# Patient Record
Sex: Female | Born: 1950 | Race: White | Hispanic: No | Marital: Married | Smoking: Former smoker
Health system: Southern US, Community
[De-identification: ages and names within clinical notes are randomized; demographics above are authoritative.]

## PROBLEM LIST (undated history)

## (undated) DIAGNOSIS — R011 Cardiac murmur, unspecified: Secondary | ICD-10-CM

## (undated) DIAGNOSIS — I499 Cardiac arrhythmia, unspecified: Secondary | ICD-10-CM

## (undated) DIAGNOSIS — R55 Syncope and collapse: Secondary | ICD-10-CM

## (undated) DIAGNOSIS — I341 Nonrheumatic mitral (valve) prolapse: Secondary | ICD-10-CM

## (undated) HISTORY — DX: Syncope and collapse: R55

## (undated) HISTORY — DX: Nonrheumatic mitral (valve) prolapse: I34.1

## (undated) HISTORY — PX: VAGINAL HYSTERECTOMY: SUR661

## (undated) HISTORY — DX: Cardiac murmur, unspecified: R01.1

## (undated) HISTORY — PX: HERNIA REPAIR: SHX51

## (undated) HISTORY — PX: OTHER SURGICAL HISTORY: SHX169

## (undated) HISTORY — DX: Cardiac arrhythmia, unspecified: I49.9

---

## 2004-07-11 ENCOUNTER — Other Ambulatory Visit: Payer: Self-pay

## 2004-07-16 ENCOUNTER — Ambulatory Visit: Payer: Self-pay | Admitting: Orthopaedic Surgery

## 2005-06-03 ENCOUNTER — Other Ambulatory Visit: Payer: Self-pay

## 2005-06-03 ENCOUNTER — Emergency Department: Payer: Self-pay | Admitting: Emergency Medicine

## 2005-09-15 ENCOUNTER — Ambulatory Visit: Payer: Self-pay | Admitting: Family Medicine

## 2006-08-05 ENCOUNTER — Emergency Department (HOSPITAL_COMMUNITY): Admission: EM | Admit: 2006-08-05 | Discharge: 2006-08-05 | Payer: Self-pay | Admitting: Emergency Medicine

## 2006-08-10 ENCOUNTER — Inpatient Hospital Stay (HOSPITAL_COMMUNITY): Admission: RE | Admit: 2006-08-10 | Discharge: 2006-08-12 | Payer: Self-pay | Admitting: Orthopedic Surgery

## 2006-09-01 ENCOUNTER — Encounter: Payer: Self-pay | Admitting: Orthopedic Surgery

## 2006-09-17 ENCOUNTER — Encounter: Payer: Self-pay | Admitting: Orthopedic Surgery

## 2006-10-18 ENCOUNTER — Encounter: Payer: Self-pay | Admitting: Orthopedic Surgery

## 2006-10-26 ENCOUNTER — Ambulatory Visit: Payer: Self-pay | Admitting: Specialist

## 2006-11-17 ENCOUNTER — Encounter: Payer: Self-pay | Admitting: Orthopedic Surgery

## 2008-04-07 ENCOUNTER — Emergency Department (HOSPITAL_COMMUNITY): Admission: EM | Admit: 2008-04-07 | Discharge: 2008-04-07 | Payer: Self-pay | Admitting: Emergency Medicine

## 2009-07-25 ENCOUNTER — Observation Stay: Payer: Self-pay | Admitting: Internal Medicine

## 2009-07-31 ENCOUNTER — Ambulatory Visit: Payer: Self-pay | Admitting: Cardiovascular Disease

## 2009-08-15 ENCOUNTER — Ambulatory Visit: Payer: Self-pay | Admitting: Family Medicine

## 2010-04-08 ENCOUNTER — Ambulatory Visit (HOSPITAL_BASED_OUTPATIENT_CLINIC_OR_DEPARTMENT_OTHER): Admission: RE | Admit: 2010-04-08 | Discharge: 2010-04-08 | Payer: Self-pay | Admitting: Urology

## 2010-07-30 LAB — POCT I-STAT 4, (NA,K, GLUC, HGB,HCT)
Hemoglobin: 15 g/dL (ref 12.0–15.0)
Sodium: 143 mEq/L (ref 135–145)

## 2010-08-22 ENCOUNTER — Observation Stay (HOSPITAL_COMMUNITY)
Admission: EM | Admit: 2010-08-22 | Discharge: 2010-08-23 | Disposition: A | Discharge status: Home / Self Care | Payer: BC Managed Care – PPO | Attending: General Surgery | Admitting: General Surgery

## 2010-08-22 ENCOUNTER — Emergency Department (HOSPITAL_COMMUNITY): Payer: BC Managed Care – PPO

## 2010-08-22 ENCOUNTER — Encounter (HOSPITAL_COMMUNITY): Payer: Self-pay | Admitting: Radiology

## 2010-08-22 DIAGNOSIS — M549 Dorsalgia, unspecified: Secondary | ICD-10-CM | POA: Insufficient documentation

## 2010-08-22 DIAGNOSIS — G8929 Other chronic pain: Secondary | ICD-10-CM | POA: Insufficient documentation

## 2010-08-22 DIAGNOSIS — R5381 Other malaise: Secondary | ICD-10-CM | POA: Insufficient documentation

## 2010-08-22 DIAGNOSIS — I059 Rheumatic mitral valve disease, unspecified: Secondary | ICD-10-CM | POA: Insufficient documentation

## 2010-08-22 DIAGNOSIS — K3580 Unspecified acute appendicitis: Principal | ICD-10-CM | POA: Insufficient documentation

## 2010-08-22 LAB — COMPREHENSIVE METABOLIC PANEL
AST: 19 U/L (ref 0–37)
CO2: 28 mEq/L (ref 19–32)
GFR calc Af Amer: >60 mL/min (ref >60)
Sodium: 138 mEq/L (ref 135–145)
Total Bilirubin: 0.4 mg/dL (ref 0.3–1.2)

## 2010-08-22 LAB — URINALYSIS, ROUTINE W REFLEX MICROSCOPIC
Bilirubin Urine: NEGATIVE
Glucose, UA: NEGATIVE mg/dL
Nitrite: NEGATIVE
Specific Gravity, Urine: 1.007 (ref 1.005–1.030)
pH: 6.5 (ref 5.0–8.0)

## 2010-08-22 LAB — DIFFERENTIAL
Basophils Absolute: 0.1 10*3/uL (ref 0.0–0.1)
Basophils Relative: 1 % (ref 0–1)
Eosinophils Absolute: 0.4 10*3/uL (ref 0.0–0.7)
Eosinophils Relative: 3 % (ref 0–5)
Lymphs Abs: 2.6 10*3/uL (ref 0.7–4.0)
Monocytes Absolute: 0.8 10*3/uL (ref 0.1–1.0)
Monocytes Relative: 7 % (ref 3–12)
Neutro Abs: 7.7 10*3/uL (ref 1.7–7.7)

## 2010-08-22 LAB — URINE MICROSCOPIC-ADD ON

## 2010-08-22 LAB — CBC
RBC: 4.27 MIL/uL (ref 3.87–5.11)
RDW: 13.6 % (ref 11.5–15.5)
WBC: 11.5 10*3/uL — ABNORMAL HIGH (ref 4.0–10.5)

## 2010-08-22 LAB — LIPASE, BLOOD: Lipase: 22 U/L (ref 11–59)

## 2010-08-22 MED ORDER — IOHEXOL 300 MG/ML  SOLN
100.0000 mL | Freq: Once | INTRAMUSCULAR | Status: AC | PRN
  Administered 2010-08-22: 100 mL via INTRAVENOUS

## 2010-08-23 ENCOUNTER — Other Ambulatory Visit: Payer: Self-pay | Admitting: General Surgery

## 2010-08-28 NOTE — H&P (Signed)
  NAMEDEISY, OZBUN            ACCOUNT NO.:  [id number]  MEDICAL RECORD NO.:  [id number]           PATIENT TYPE:  O  LOCATION:  5155                         FACILITY:  [facility]  PHYSICIAN:  Lennie Muckle, MD      DATE OF BIRTH:  Sep 25, 1950  DATE OF ADMISSION:  08/22/2010 DATE OF DISCHARGE:  08/23/2010                             HISTORY & PHYSICAL   CHIEF COMPLAINT:  Abdominal pain.  HISTORY OF PRESENT ILLNESS:  Ms. Louks is a 60 year old female who had onset of abdominal pain yesterday.  It was mostly in the right lower quadrant.  The pain persisted today.  She had no nausea or vomiting but had anorexia.  She has had no diarrhea.  She had bronchitis approximately 2 months ago and was placed on antibiotics and had been doing better but she had been chronically tired.  The abdominal pain, however, was new.  She did receive a CT scan which showed a thickened appendix and stranding consistent with acute appendicitis.  Her white count was elevated to 11.5.  PAST MEDICAL HISTORY: 1. Chronic back pain. 2. Interstitial cystitis. 3. Mitral valve prolapse.  PAST SURGICAL HISTORY:  She had a hysterectomy, hernia repair, tubal ligation, and knee surgery.  FAMILY HISTORY:  Noncontributory.  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 No tobacco or alcohol use.  DRUG ALLERGIES:  SULFA.  MEDICATIONS:  Amitriptyline, Elmiron, and fludrocortisone.  REVIEW OF SYSTEMS:  Negative other than in the HPI.  PHYSICAL EXAMINATION:  GENERAL:  She is a well-developed, well-nourished female lying in stretcher in no acute distress. VITAL SIGNS:  Temperature is 98.5, pulse 79, and blood pressure 159/79. HEENT:  Head is normocephalic and atraumatic.  Extraocular movements are intact.  Pupils are equal, round, and reactive to light.  Sclerae and conjunctivae are clear.  Oral mucosa is pink and moist.  Dentition is good. NECK:  No tenderness.  Normal range of motion.   The trachea is midline. The thyroid is without palpable abnormalities. CHEST:  Clear to auscultation bilaterally.  Normal expansion and excursion. CARDIOVASCULAR:  Regular rate and rhythm.  No murmurs, gallops, or rubs. ABDOMEN:  Soft.  She does have some mild tenderness in the right lower quadrant with no peritoneal signs.  No rebound tenderness.  No organomegaly or masses. MUSCULOSKELETAL:  No pain with palpation in the joints.  Normal range of motion. SKIN:  No rashes and no jaundice. PSYCHOLOGIC:  Normal affect.  IMAGING:  CT was reviewed.  There is a thickened appendix with stranding noted.  ASSESSMENT AND PLAN:  Acute appendicitis.  We will admit with IV antibiotics and plan laparoscopic appendectomy later in the morning. Risk of surgery including, but not limited to bleeding, infection, abscess formation were explained.  Hopefully, should be able to be discharged home later on tomorrow afternoon.     Lennie Muckle, MD     ALA/MEDQ  D:  08/22/2010  T:  08/23/2010  Job:  [id number]  Electronically Signed by Bertram Savin MD on 08/28/2010 12:34:05 PM

## 2010-08-28 NOTE — Op Note (Signed)
Allison [name], Allison [name]            ACCOUNT NO.:  [id number]  MEDICAL RECORD NO.:  [id number]           PATIENT TYPE:  O  LOCATION:  5155                         FACILITY:  [facility]  PHYSICIAN:  Lennie Muckle, MD      DATE OF BIRTH:  1950/07/20  DATE OF PROCEDURE:  08/23/2010 DATE OF DISCHARGE:  08/23/2010                              OPERATIVE REPORT   PREOPERATIVE DIAGNOSIS:  Acute appendicitis.  POSTOPERATIVE DIAGNOSIS:  Acute appendicitis.  PROCEDURE:  Laparoscopic appendectomy.  SURGEON:  Kyshawn Teal L. Freida Busman, MD  ASSISTANT:  OR staff.  FINDINGS:  Early appendicitis.  No purulent fluid within the pelvis.  SPECIMENS:  Appendix.  ESTIMATED BLOOD LOSS:  Minimal.  ANESTHESIA:  General endotracheal anesthesia.  No immediate complications.  INDICATIONS FOR PROCEDURE:  Allison [name] is a 60 year old female coming with abdominal pain on the left side.  Her white count and CT findings were suggestive of acute appendicitis.  She was admitted, placed on Invanz, and informed consent was obtained for appendectomy.  Risks of surgery were explained to the patient.  She agreed and informed consent was obtained.  DETAILS OF PROCEDURE:  Allison [name] was taken from the preoperative holding area seen by Anesthesia to the operating room.  She was placed in a supine position.  After administration of general endotracheal anesthesia, her abdomen was prepped and draped in the usual sterile fashion.  A Foley was not placed.  This patient had voided in the preoperative holding area.  Once her abdomen was prepped and draped, a surgical time-out was performed.  I began by placing a incision in the right upper quadrant.  Using 5-mm trocar and OptiVu placed the  camera into the abdominal cavity.  All layers of abdominal wall were visualized upon entry.  After insufflating the abdomen, I inspected the abdomen and found no evidence of injury after placement of the trocar.  The patient was placed in reverse  Trendelenburg position.  An incision was placed at the umbilical region and the 5-mm trocar was placed without difficulty and 11-mm trocar was placed in left lower quadrant on visualization with camera.  I then placed the patient in right side up position, identified the appendix in the right lower quadrant.  It had mild inflammatory changes and was slightly thickened at the tip.  I began by dissecting the lateral attachment of the appendix to lateral sidewall with the Harmonic scalpel.  Once I fully mobilized the appendix, I came across the mesoappendix with the Harmonic scalpel.  I then placed the laparoscopic GIA stapler at the base of the appendix incorporating a small portion of the cecum.  The appendix was placed in an EndoCatch bag and removed from the abdomen.  I irrigated the abdomen with 3 L of saline.  There was no purulent fluid within the pelvis.  After irrigating, I inspected the staple line which was intact.  There was no bleeding from the mesoappendix.  I then did one final inspection of the abdomen and found no evidence of bleeding or injury.  I then closed the fascial defect at the left lower quadrant using the 0-Vicryl suture. Once this  was closed, 20 mL of 0.25% Marcaine was placed for local anesthesia at all sites.  The patient released the pneumoperitoneum with the trocars and was closed with 4-0 Monocryl.  Dermabond was placed for final dressing.  The patient will be discharged home today and follow up in approximately 2-3 weeks' time.  She was instructed to perform no heavy lifting over 20 pounds.     Lennie Muckle, MD     ALA/MEDQ  D:  08/23/2010  T:  08/23/2010  Job:  [id number]  Electronically Signed by Bertram Savin MD on 08/28/2010 12:34:45 PM

## 2010-10-04 NOTE — Op Note (Signed)
[name], Allison NO.:  [id number]   MEDICAL RECORD NO.:  [id number]          PATIENT TYPE:  EMS   LOCATION:  MAJO                         FACILITY:  [facility]   PHYSICIAN:  Loreta Ave, M.D. 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   POSTOPERATIVE DIAGNOSIS:  End-stage degenerative arthritis  patellofemoral joint left knee with persistent tethering and re-scarring  of previous lateral retinacular release.   PROCEDURE:  Unicompartmental replacement, left knee, patellofemoral  joint with Stryker patellofemoral prosthesis.  Cemented pegged medium  femoral component, resurfacing small patellar component, polyethylene  medial offset.  Revision lateral retinacular release.   SURGEON:  Loreta Ave, M.D.  (present throughout the entire case)   ASSISTANT:  Genene Churn. Barry Dienes, P. A.   ANESTHESIA:  General.   SPECIMENS:  None.   CULTURES:  None.   COMPLICATIONS:  None.   DRESSING:  Soft compressive knee immobilizer.   PROCEDURE:  The patient was brought to the operating room and placed on  the operating room table in supine position.  After adequate anesthesia  had been obtained, the left leg was examined.  Full extension, good  flexion and good stability.  Increased Q angle.  Some recurrent  tethering inferior aspect of patellofemoral joint, lateral aspect.  Increased Q angle.   A tourniquet was applied.  Prepped and draped in the usual sterile  fashion.  Exsanguinated with elevation and Esmarch.  Tourniquet inflated  to 350 mmHg.   Straight incision above the patella down to the tibial tubercle.  Medial  arthrotomy.  Knee exposed.  Grade IV changes patellofemoral joint.  Trochlea some change, not as bad.  Medial compartment, lateral  compartment, medial and  lateral meniscus, cruciate ligaments intact.   After adequate exposure of the knee and thorough inspection, attention  was turned to the femur.  Intermedullary guide.  Anterior cut made  aligning the distal aspect of the femur and making the cut in about 5  degrees of external rotation to maximize patellofemoral tracking.  Sized  for a medium component.  Guide put in place.  The trochlea was then  prepared first with a knife on the cartilage. Rongeurs, then osteotome  and bur.  Brought down to a sufficient depth that the trochlear  component fit well and nice and smooth interface between the component  and articular cartilage.  Set slightly lateral to the intercondylar  notch and about 2 mm above it.  Pre-drilled for the definitive  component.   The definitive component was tried in place and found to cover the area  very well with a nice interface of articular cartilage.  The patella was  then measured, and a posterior 9 mm resected.  Sized for a small  component and drilled for that component.  Trials put in place on both  sides.  After revision of the lateral release, inferior aspect, it had  good patellofemoral tracking, good stability, full motion.  The revision  of the lateral release with cautery.  All trials were removed.   Copious irrigation with the pulse irrigating device.  Both components  were firmly cemented.  Excess cement was removed.  When the cement  hardened, the knee was re-examined.  I was very pleased with the  alignment, stability, motion and patellofemoral tracking.  No recurrent  tethering with the revision release done.  Wound irrigated.   The arthrotomy was closed with #1 Vicryl, the skin and subcutaneous  tissue with Vicryl and staples.  The knee was injected with Marcaine.  A  sterile compression dressing was applied.  Tourniquet deflated and  removed.  Knee immobilizer applied.  Anesthesia reversed.  Brought to  the recovery room.  Tolerated the surgery  well.  No complications.      Loreta Ave, M.D.  Electronically Signed     DFM/MEDQ  D:  08/10/2006  T:  08/10/2006  Job:  [id number]

## 2011-02-19 LAB — CBC
HCT: 41.9
MCHC: 33.8
Platelets: 415 — ABNORMAL HIGH
WBC: 8.8

## 2011-02-19 LAB — DIFFERENTIAL
Basophils Absolute: 0.1
Basophils Relative: 1
Eosinophils Absolute: 0.5
Eosinophils Relative: 6 — ABNORMAL HIGH
Lymphs Abs: 1.8
Monocytes Relative: 5
Neutrophils Relative %: 68

## 2011-02-19 LAB — URINALYSIS, ROUTINE W REFLEX MICROSCOPIC
Bilirubin Urine: NEGATIVE
Glucose, UA: NEGATIVE
Nitrite: NEGATIVE
Protein, ur: NEGATIVE

## 2011-02-19 LAB — HEPATIC FUNCTION PANEL
ALT: 45 — ABNORMAL HIGH
Albumin: 4.3
Alkaline Phosphatase: 90
Bilirubin, Direct: <0.1
Total Bilirubin: 0.5

## 2011-02-19 LAB — POCT I-STAT, CHEM 8
Calcium, Ion: 1.24
Glucose, Bld: 89
Potassium: 3.5

## 2011-02-19 LAB — URINE MICROSCOPIC-ADD ON

## 2011-10-09 ENCOUNTER — Encounter: Payer: Self-pay | Admitting: Cardiology

## 2012-03-03 ENCOUNTER — Encounter: Payer: Self-pay | Admitting: *Deleted

## 2012-05-26 ENCOUNTER — Ambulatory Visit: Payer: Self-pay | Admitting: Family Medicine

## 2015-02-14 ENCOUNTER — Other Ambulatory Visit: Payer: Self-pay | Admitting: Family Medicine

## 2015-02-14 DIAGNOSIS — R1011 Right upper quadrant pain: Secondary | ICD-10-CM

## 2015-02-14 DIAGNOSIS — R110 Nausea: Secondary | ICD-10-CM

## 2015-02-15 ENCOUNTER — Ambulatory Visit
Admission: RE | Admit: 2015-02-15 | Discharge: 2015-02-15 | Disposition: A | Discharge status: Home / Self Care | Payer: BC Managed Care – PPO | Source: Ambulatory Visit | Attending: Family Medicine | Admitting: Family Medicine

## 2015-02-15 DIAGNOSIS — R1011 Right upper quadrant pain: Secondary | ICD-10-CM | POA: Diagnosis present

## 2015-02-15 DIAGNOSIS — R110 Nausea: Secondary | ICD-10-CM

## 2015-03-02 ENCOUNTER — Other Ambulatory Visit: Payer: Self-pay | Admitting: Family Medicine

## 2015-03-02 DIAGNOSIS — R1084 Generalized abdominal pain: Secondary | ICD-10-CM

## 2015-03-05 ENCOUNTER — Ambulatory Visit
Admission: RE | Admit: 2015-03-05 | Discharge: 2015-03-05 | Disposition: A | Discharge status: Home / Self Care | Payer: BC Managed Care – PPO | Source: Ambulatory Visit | Attending: Family Medicine | Admitting: Family Medicine

## 2015-03-05 DIAGNOSIS — R1084 Generalized abdominal pain: Secondary | ICD-10-CM

## 2015-03-05 MED ORDER — IOHEXOL 300 MG/ML  SOLN
100.0000 mL | Freq: Once | INTRAMUSCULAR | Status: AC | PRN
  Administered 2015-03-05: 100 mL via INTRAVENOUS

## 2016-04-28 ENCOUNTER — Emergency Department
Admission: EM | Admit: 2016-04-28 | Discharge: 2016-04-28 | Disposition: A | Discharge status: Home / Self Care | Payer: Medicare Other | Attending: Emergency Medicine | Admitting: Emergency Medicine

## 2016-04-28 ENCOUNTER — Encounter: Payer: Self-pay | Admitting: Emergency Medicine

## 2016-04-28 ENCOUNTER — Emergency Department: Payer: Medicare Other

## 2016-04-28 DIAGNOSIS — Z87891 Personal history of nicotine dependence: Secondary | ICD-10-CM | POA: Diagnosis not present

## 2016-04-28 DIAGNOSIS — R42 Dizziness and giddiness: Secondary | ICD-10-CM

## 2016-04-28 DIAGNOSIS — R109 Unspecified abdominal pain: Secondary | ICD-10-CM | POA: Diagnosis not present

## 2016-04-28 DIAGNOSIS — R112 Nausea with vomiting, unspecified: Secondary | ICD-10-CM | POA: Insufficient documentation

## 2016-04-28 LAB — URINALYSIS, COMPLETE (UACMP) WITH MICROSCOPIC
BACTERIA UA: NONE SEEN
Bilirubin Urine: NEGATIVE
Glucose, UA: NEGATIVE mg/dL
Ketones, ur: 5 mg/dL — AB
Nitrite: NEGATIVE
PROTEIN: NEGATIVE mg/dL
Specific Gravity, Urine: 1.018 (ref 1.005–1.030)
pH: 6 (ref 5.0–8.0)

## 2016-04-28 LAB — CBC
HEMATOCRIT: 37.1 % (ref 35.0–47.0)
HEMOGLOBIN: 12.8 g/dL (ref 12.0–16.0)
MCH: 28.9 pg (ref 26.0–34.0)
MCHC: 34.6 g/dL (ref 32.0–36.0)
MCV: 83.5 fL (ref 80.0–100.0)
Platelets: 518 10*3/uL — ABNORMAL HIGH (ref 150–440)
RBC: 4.44 MIL/uL (ref 3.80–5.20)
RDW: 14.7 % — AB (ref 11.5–14.5)
WBC: 9.8 10*3/uL (ref 3.6–11.0)

## 2016-04-28 LAB — COMPREHENSIVE METABOLIC PANEL
ALT: 14 U/L (ref 14–54)
ANION GAP: 6 (ref 5–15)
AST: 19 U/L (ref 15–41)
Albumin: 4.2 g/dL (ref 3.5–5.0)
Alkaline Phosphatase: 72 U/L (ref 38–126)
BILIRUBIN TOTAL: 0.6 mg/dL (ref 0.3–1.2)
BUN: 19 mg/dL (ref 6–20)
CHLORIDE: 103 mmol/L (ref 101–111)
CO2: 26 mmol/L (ref 22–32)
Calcium: 9.4 mg/dL (ref 8.9–10.3)
Creatinine, Ser: 0.89 mg/dL (ref 0.44–1.00)
GFR calc Af Amer: >60 mL/min (ref >60)
GFR calc non Af Amer: >60 mL/min (ref >60)
GLUCOSE: 109 mg/dL — AB (ref 65–99)
POTASSIUM: 4.5 mmol/L (ref 3.5–5.1)
SODIUM: 135 mmol/L (ref 135–145)
TOTAL PROTEIN: 6.8 g/dL (ref 6.5–8.1)

## 2016-04-28 LAB — LIPASE, BLOOD: LIPASE: 20 U/L (ref 11–51)

## 2016-04-28 LAB — TROPONIN I

## 2016-04-28 MED ORDER — MECLIZINE HCL 25 MG PO TABS
25.0000 mg | ORAL_TABLET | Freq: Once | ORAL | Status: AC
  Administered 2016-04-28: 25 mg via ORAL
  Filled 2016-04-28: qty 1

## 2016-04-28 MED ORDER — ONDANSETRON 4 MG PO TBDP
4.0000 mg | ORAL_TABLET | Freq: Once | ORAL | Status: AC | PRN
  Administered 2016-04-28: 4 mg via ORAL

## 2016-04-28 MED ORDER — MECLIZINE HCL 25 MG PO TABS: 25.0000 mg | ORAL_TABLET | Freq: Three times a day (TID) | ORAL | 0 refills | Status: AC | PRN

## 2016-04-28 MED ORDER — ONDANSETRON 4 MG PO TBDP
ORAL_TABLET | ORAL | Status: AC
  Filled 2016-04-28: qty 1

## 2016-04-28 NOTE — ED Provider Notes (Signed)
[facility] Emergency Department Provider Note  ____________________________________________   I have reviewed the triage vital signs and the nursing notes.   HISTORY  Chief Complaint Dizziness  History limited by: Not Limited   HPI ELEENE NICOLAUS is a 65 y.o. female who presents to the emergency department today with primary concerns for dizziness.The patient states that this symptoms started suddenly. It started roughly 4-1/2 hours prior to my evaluation. The patient states she was lying on her couch when she felt a. She describes the dizziness as feeling somewhat lightheaded. She is also had an associated headache which is worse in the back of her head. Initially she has had some nausea and vomiting. The patient denies any unusual activity today.   Past Medical History:  Diagnosis Date  . Arrhythmia   . Heart murmur   . Mitral valve prolapse   . Syncope     There are no active problems to display for this patient.   Past Surgical History:  Procedure Laterality Date  . HERNIA REPAIR    . partial knee replacement     Left  . VAGINAL HYSTERECTOMY      Prior to Admission medications   Medication Sig Start Date End Date Taking? Authorizing Provider  amitriptyline (ELAVIL) 25 MG tablet Take 25 mg by mouth at bedtime.    Historical Provider, MD  fludrocortisone (FLORINEF) 0.1 MG tablet Take 0.1 mg by mouth daily.    Historical Provider, MD  pentosan polysulfate (ELMIRON) 100 MG capsule Take 100 mg by mouth 2 (two) times daily.    Historical Provider, MD    Allergies Sulfa antibiotics  Family History  Problem Relation Age of Onset  . Heart attack Father     cause of death.  . Hyperlipidemia Mother   . Dementia Mother   . Stroke Mother     Social History Social History  Substance Use Topics  . Smoking status: Former Smoker    Quit date: 10/08/1988  . Smokeless tobacco: Not on file  . Alcohol use Yes     Comment: wine or beer one a  day.     Review of Systems  Constitutional: Negative for fever. Cardiovascular: Negative for chest pain. Respiratory: Negative for shortness of breath. Gastrointestinal: Negative for abdominal pain. Positive for nausea and vomiting.  Neurological: Negative for headaches, focal weakness or numbness.  10-point ROS otherwise negative.  ____________________________________________   PHYSICAL EXAM:  VITAL SIGNS: ED Triage Vitals  Enc Vitals Group     BP 04/28/16 1411 (!) 157/82     Pulse Rate 04/28/16 1411 80     Resp 04/28/16 1411 16     Temp 04/28/16 1411 97.7 F (36.5 C)     Temp Source 04/28/16 1411 Oral     SpO2 04/28/16 1411 100 %     Weight 04/28/16 1513 140 lb (63.5 kg)     Height 04/28/16 1513 5\' 7"  (1.702 m)   Constitutional: Alert and oriented. Well appearing and in no distress. Eyes: Conjunctivae are normal. Normal extraocular movements. ENT   Head: Normocephalic and atraumatic.   Nose: No congestion/rhinnorhea.   Mouth/Throat: Mucous membranes are moist.   Neck: No stridor. Hematological/Lymphatic/Immunilogical: No cervical lymphadenopathy. Cardiovascular: Normal rate, regular rhythm.  No murmurs, rubs, or gallops.  Respiratory: Normal respiratory effort without tachypnea nor retractions. Breath sounds are clear and equal bilaterally. No wheezes/rales/rhonchi. Gastrointestinal: Soft and nontender. No distention.  Genitourinary: Deferred Musculoskeletal: Normal range of motion in all extremities. No lower extremity  edema. Neurologic:  Normal speech and language. Face symmetric. Some left gaze nystagmus. Strength 5/5 in upper extremities. Slightly decreased strength in left lower extremity however patient states this is her baseline s/p patellar reconstruction. Finger to nose and heel to shin normal. However patient unstable with standing.  Skin:  Skin is warm, dry and intact. No rash noted. Psychiatric: Mood and affect are normal. Speech and behavior  are normal. Patient exhibits appropriate insight and judgment.  ____________________________________________    LABS (pertinent positives/negatives)  Labs Reviewed  CBC - Abnormal; Notable for the following:       Result Value   RDW 14.7 (*)    Platelets 518 (*)    All other components within normal limits  URINALYSIS, COMPLETE (UACMP) WITH MICROSCOPIC - Abnormal; Notable for the following:    Color, Urine YELLOW (*)    APPearance CLEAR (*)    Hgb urine dipstick MODERATE (*)    Ketones, ur 5 (*)    Leukocytes, UA MODERATE (*)    Squamous Epithelial / LPF 0-5 (*)    All other components within normal limits  COMPREHENSIVE METABOLIC PANEL - Abnormal; Notable for the following:    Glucose, Bld 109 (*)    All other components within normal limits  URINE CULTURE  TROPONIN I  LIPASE, BLOOD  CBG MONITORING, ED     ____________________________________________   EKG  I, Nance Pear, attending physician, personally viewed and interpreted this EKG  EKG Time: 1516 Rate: 69 Rhythm: normal sinus rhythm Axis: normal Intervals: qtc 452 QRS: narrow, low voltage ST changes: no st elevation Impression: abnormal ekg   ____________________________________________    RADIOLOGY  MRI  IMPRESSION: Normal MRI of the brain.  ____________________________________________   PROCEDURES  Procedures  ____________________________________________   INITIAL IMPRESSION / ASSESSMENT AND PLAN / ED COURSE  Pertinent labs & imaging results that were available during my care of the patient were reviewed by me and considered in my medical decision making (see chart for details).  Patient presented to the emergency department today because of concerns for dizziness. This started suddenly. MRI was performed which did not show any signs of cerebellar stroke or pathology. Patient did feel somewhat better after meclizine. Patient's urine does did show some signs of urinary tract  infection however patient states she has history of chronic cystitis. She would like to defer antibiotics at this time until urine culture will result. Will give patient prescription for meclizine. Have patient follow-up with primary care. ____________________________________________   FINAL CLINICAL IMPRESSION(S) / ED DIAGNOSES  Final diagnoses:  Dizziness     Note: This dictation was prepared with Dragon dictation. Any transcriptional errors that result from this process are unintentional    Nance Pear, MD 04/28/16 2310

## 2016-04-28 NOTE — ED Notes (Signed)
Spoke with dr Mariea Clonts, will determine possible imaging on exam.

## 2016-04-28 NOTE — ED Notes (Addendum)
Patient transported to MRI, with Marcie Bal, EDT

## 2016-04-28 NOTE — Discharge Instructions (Signed)
Please seek medical attention for any high fevers, chest pain, shortness of breath, change in behavior, persistent vomiting, bloody stool or any other new or concerning symptoms.  

## 2016-04-28 NOTE — ED Notes (Signed)
Patient reports laying down and her heart was doing weird things. Reports the dizziness, headache, vomiting and heart started about 1300 today.

## 2016-04-28 NOTE — ED Triage Notes (Addendum)
Pt c/o dizziness, even when laying down per pt. Also c/o vomiting and mid abdominal pain. Denies diarrhea. Denies blood in vomit or fevers. Sx started at 100 pm today. Feels tingly all over body per pt. Has generalized weakness. Dizziness worse when moves head. Has had vertigo before but not sure if this feels same. C/o frontal headache as well. Not worst headache.  Rarely gets headaches per pt.

## 2016-04-30 LAB — URINE CULTURE

## 2017-09-19 ENCOUNTER — Other Ambulatory Visit: Payer: Self-pay

## 2017-09-19 ENCOUNTER — Emergency Department
Admission: EM | Admit: 2017-09-19 | Discharge: 2017-09-20 | Disposition: A | Discharge status: Home / Self Care | Payer: Medicare Other | Attending: Emergency Medicine | Admitting: Emergency Medicine

## 2017-09-19 ENCOUNTER — Encounter: Payer: Self-pay | Admitting: Emergency Medicine

## 2017-09-19 ENCOUNTER — Emergency Department: Payer: Medicare Other

## 2017-09-19 DIAGNOSIS — R10811 Right upper quadrant abdominal tenderness: Secondary | ICD-10-CM

## 2017-09-19 DIAGNOSIS — E871 Hypo-osmolality and hyponatremia: Secondary | ICD-10-CM | POA: Insufficient documentation

## 2017-09-19 DIAGNOSIS — R1011 Right upper quadrant pain: Secondary | ICD-10-CM | POA: Insufficient documentation

## 2017-09-19 DIAGNOSIS — Z87891 Personal history of nicotine dependence: Secondary | ICD-10-CM | POA: Diagnosis not present

## 2017-09-19 DIAGNOSIS — R110 Nausea: Secondary | ICD-10-CM | POA: Diagnosis not present

## 2017-09-19 DIAGNOSIS — Z79899 Other long term (current) drug therapy: Secondary | ICD-10-CM | POA: Diagnosis not present

## 2017-09-19 LAB — TROPONIN I: Troponin I: <0.03 ng/mL (ref <0.03)

## 2017-09-19 LAB — CBC
HCT: 35.6 % (ref 35.0–47.0)
Hemoglobin: 12.2 g/dL (ref 12.0–16.0)
MCH: 27.8 pg (ref 26.0–34.0)
MCHC: 34.2 g/dL (ref 32.0–36.0)
MCV: 81.2 fL (ref 80.0–100.0)
Platelets: 453 10*3/uL — ABNORMAL HIGH (ref 150–440)
RBC: 4.38 MIL/uL (ref 3.80–5.20)
RDW: 15 % — ABNORMAL HIGH (ref 11.5–14.5)
WBC: 8.9 10*3/uL (ref 3.6–11.0)

## 2017-09-19 LAB — COMPREHENSIVE METABOLIC PANEL
ALT: 110 U/L — ABNORMAL HIGH (ref 14–54)
AST: 80 U/L — ABNORMAL HIGH (ref 15–41)
Albumin: 4.1 g/dL (ref 3.5–5.0)
Alkaline Phosphatase: 123 U/L (ref 38–126)
Anion gap: 9 (ref 5–15)
BUN: 15 mg/dL (ref 6–20)
CO2: 24 mmol/L (ref 22–32)
Calcium: 9 mg/dL (ref 8.9–10.3)
Chloride: 95 mmol/L — ABNORMAL LOW (ref 101–111)
Creatinine, Ser: 1.06 mg/dL — ABNORMAL HIGH (ref 0.44–1.00)
GFR calc Af Amer: >60 mL/min (ref >60)
GFR calc non Af Amer: 53 mL/min — ABNORMAL LOW (ref >60)
Glucose, Bld: 109 mg/dL — ABNORMAL HIGH (ref 65–99)
Potassium: 3.4 mmol/L — ABNORMAL LOW (ref 3.5–5.1)
Sodium: 128 mmol/L — ABNORMAL LOW (ref 135–145)
Total Bilirubin: 0.7 mg/dL (ref 0.3–1.2)
Total Protein: 7.1 g/dL (ref 6.5–8.1)

## 2017-09-19 LAB — LIPASE, BLOOD: Lipase: 24 U/L (ref 11–51)

## 2017-09-19 LAB — URINALYSIS, COMPLETE (UACMP) WITH MICROSCOPIC
Bacteria, UA: NONE SEEN
Bilirubin Urine: NEGATIVE
Glucose, UA: NEGATIVE mg/dL
Ketones, ur: 5 mg/dL — AB
Nitrite: NEGATIVE
Protein, ur: NEGATIVE mg/dL
Specific Gravity, Urine: 1.01 (ref 1.005–1.030)
pH: 6 (ref 5.0–8.0)

## 2017-09-19 MED ORDER — ONDANSETRON HCL 4 MG/2ML IJ SOLN
4.0000 mg | Freq: Once | INTRAMUSCULAR | Status: AC
  Administered 2017-09-19: 4 mg via INTRAVENOUS
  Filled 2017-09-19: qty 2

## 2017-09-19 MED ORDER — IOPAMIDOL (ISOVUE-370) INJECTION 76%
75.0000 mL | Freq: Once | INTRAVENOUS | Status: AC | PRN
  Administered 2017-09-19: 75 mL via INTRAVENOUS

## 2017-09-19 MED ORDER — SODIUM CHLORIDE 0.9 % IV BOLUS
1000.0000 mL | Freq: Once | INTRAVENOUS | Status: AC
  Administered 2017-09-19: 1000 mL via INTRAVENOUS

## 2017-09-19 MED ORDER — IBUPROFEN 600 MG PO TABS
600.0000 mg | ORAL_TABLET | ORAL | Status: AC
  Administered 2017-09-19: 600 mg via ORAL
  Filled 2017-09-19: qty 1

## 2017-09-19 MED ORDER — IOPAMIDOL (ISOVUE-300) INJECTION 61%
30.0000 mL | Freq: Once | INTRAVENOUS | Status: AC
  Administered 2017-09-19: 30 mL via ORAL

## 2017-09-19 NOTE — ED Notes (Signed)
Pt returned from CT °

## 2017-09-19 NOTE — ED Provider Notes (Addendum)
[facility] Emergency Department Provider Note   ____________________________________________   First MD Initiated Contact with Patient 09/19/17 2133     (approximate)  I have reviewed the triage vital signs and the nursing notes.   HISTORY  Chief Complaint Abdominal Pain and Diarrhea    HPI Allison [name] is a 67 y.o. female previous history of syncope, heart arrhythmia  Patient reports that she is been sick for about 8 to 9 days with intermittent discomfort primarily in her right upper abdomen.  Occasionally associated with somewhat loose stool that occurs about once every 1 to 2 days.  Some nausea but no vomiting.  No fevers or chills.  She reports she would have sought medical care earlier, but her husband has recently been hospitalized and just left on Wednesday.  She also feels dehydrated and reports she really cannot eat much of anything at all she had eat today was yogurt.  Currently reports minimal discomfort primarily in the upper right upper abdomen.  No black or bloody stool.   Past Medical History:  Diagnosis Date  . Arrhythmia   . Heart murmur   . Mitral valve prolapse   . Syncope     There are no active problems to display for this patient.   Past Surgical History:  Procedure Laterality Date  . HERNIA REPAIR    . partial knee replacement     Left  . VAGINAL HYSTERECTOMY      Prior to Admission medications   Medication Sig Start Date End Date Taking? Authorizing Provider  amitriptyline (ELAVIL) 25 MG tablet Take 25 mg by mouth at bedtime.    [provider]  fludrocortisone (FLORINEF) 0.1 MG tablet Take 0.1 mg by mouth daily.    [provider]  meclizine (ANTIVERT) 25 MG tablet Take 1 tablet (25 mg total) by mouth 3 (three) times daily as needed for dizziness. 04/28/16   Nance Pear, MD  pentosan polysulfate (ELMIRON) 100 MG capsule Take 100 mg by mouth 2 (two) times daily.    [provider]    Allergies Sulfa antibiotics  Family History  Problem Relation Age of Onset  . Heart attack Father        cause of death.  . Hyperlipidemia Mother   . Dementia Mother   . Stroke Mother     Social History Social History   Tobacco Use  . Smoking status: Former Smoker    Last attempt to quit: 10/08/1988    Years since quitting: 28.9  Substance Use Topics  . Alcohol use: Yes    Comment: wine or beer one a day.   . Drug use: Not on file    Review of Systems Constitutional: No fever/chills Eyes: No visual changes. ENT: No sore throat. Cardiovascular: Denies chest pain. Respiratory: Denies shortness of breath. Gastrointestinal: About 1 loose stool daily. No constipation. Genitourinary: Negative for dysuria. Musculoskeletal: Negative for back pain. Skin: Negative for rash. Neurological: Negative for headaches, focal weakness or numbness.    ____________________________________________   PHYSICAL EXAM:  VITAL SIGNS: ED Triage Vitals  Enc Vitals Group     BP 09/19/17 1737 (!) 144/99     Pulse Rate 09/19/17 1737 (!) 107     Resp 09/19/17 1737 20     Temp 09/19/17 1737 98.8 F (37.1 C)     Temp Source 09/19/17 1737 Oral     SpO2 09/19/17 1737 100 %     Weight 09/19/17 1738 140 lb (63.5 kg)  Height 09/19/17 1738 5\' 7"  (1.702 m)     Head Circumference --      Peak Flow --      Pain Score 09/19/17 1738 8     Pain Loc --      Pain Edu? --      Excl. in [place]? --     Constitutional: Alert and oriented. Well appearing and in no acute distress. Eyes: Conjunctivae are normal. Head: Atraumatic. Nose: No congestion/rhinnorhea. Mouth/Throat: Mucous membranes are slightly dry. Neck: No stridor.   Cardiovascular: Normal rate, regular rhythm. Grossly normal heart sounds.  Good peripheral circulation. Respiratory: Normal respiratory effort.  No retractions. Lungs CTAB. Gastrointestinal: Soft and nontender in all quadrants after some mild discomfort to palpation in  the right upper abdomen but negative Murphy.. No distention.  No rebound or guarding any quadrant.  Musculoskeletal: No lower extremity tenderness nor edema. Neurologic:  Normal speech and language. No gross focal neurologic deficits are appreciated.  Skin:  Skin is warm, dry and intact. No rash noted. Psychiatric: Mood and affect are normal. Speech and behavior are normal.  ____________________________________________   LABS (all labs ordered are listed, but only abnormal results are displayed)  Labs Reviewed  COMPREHENSIVE METABOLIC PANEL - Abnormal; Notable for the following components:      Result Value   Sodium 128 (*)    Potassium 3.4 (*)    Chloride 95 (*)    Glucose, Bld 109 (*)    Creatinine, Ser 1.06 (*)    AST 80 (*)    ALT 110 (*)    GFR calc non Af Amer 53 (*)    All other components within normal limits  CBC - Abnormal; Notable for the following components:   RDW 15.0 (*)    Platelets 453 (*)    All other components within normal limits  URINALYSIS, COMPLETE (UACMP) WITH MICROSCOPIC - Abnormal; Notable for the following components:   Color, Urine YELLOW (*)    APPearance HAZY (*)    Hgb urine dipstick SMALL (*)    Ketones, ur 5 (*)    Leukocytes, UA MODERATE (*)    All other components within normal limits  URINE CULTURE  LIPASE, BLOOD  TROPONIN I  BASIC METABOLIC PANEL   ____________________________________________  EKG  ED ECG REPORT I, Delman Kitten, the attending physician, personally viewed and interpreted this ECG.  Date: 09/20/2017 EKG Time: 2200 Rate: 90 Rhythm: normal sinus rhythm QRS Axis: normal Intervals: normal ST/T Wave abnormalities: normal Narrative Interpretation: no evidence of acute ischemia  ____________________________________________  RADIOLOGY  CT abdomen pelvis negative for acute.  No no gallstones or gallbladder wall thickening. ____________________________________________   PROCEDURES  Procedure(s) performed:  None  Procedures  Critical Care performed: No  ____________________________________________   INITIAL IMPRESSION / ASSESSMENT AND PLAN / ED COURSE  Pertinent labs & imaging results that were available during my care of the patient were reviewed by me and considered in my medical decision making (see chart for details).  Differential diagnosis includes but is not limited to, abdominal perforation, aortic dissection, cholecystitis, appendicitis, diverticulitis, colitis, esophagitis/gastritis, kidney stone, pyelonephritis, urinary tract infection, aortic aneurysm. All are considered in decision and treatment plan. Based upon the patient's presentation and risk factors, will proceed with CT scan of the abdomen pelvis to further evaluate for etiology.  She has a very very mild transaminitis that does not fit an alcoholic pattern.  In addition she has just minimal tenderness in the right upper quadrant without rebound or guarding.  No evidence of peritonitis or acute abdomen.  She may experiencing discomfort intermittently with nausea and decreased appetite.  Her sodium level is 128 I suspect this is likely from dehydration given her decreased oral intake.  No cardiac or pulmonary symptoms.  EKG and troponin normal  ----------------------------------------- 12:47 AM on 09/20/2017 ----------------------------------------- Patient resting comfortably reports all of her pain is gone.  No ongoing nausea and she is been able to tolerate by mouth well now without any difficulty.  Discussed with the patient, she is comfortable with a plan to follow-up with her primary care doctor with return precautions which I discussed, but before discharge I have redrawn her sodium.  Dr. Dahlia Client will follow up on her sodium, if her sodium is normalized anticipate discharge home with prescription for Zofran and abdominal return precautions, but if ongoing abnormality with hyponatremia anticipate admission for further  work-up under the hospitalist service.          ____________________________________________   FINAL CLINICAL IMPRESSION(S) / ED DIAGNOSES  Final diagnoses:  Hyponatremia  Nausea  Right upper quadrant abdominal tenderness, rebound tenderness presence not specified      NEW MEDICATIONS STARTED DURING THIS VISIT:  New Prescriptions   No medications on file     Note:  This document was prepared using Dragon voice recognition software and may include unintentional dictation errors.     Delman Kitten, MD 09/20/17 0049    ----------------------------------------- 1:00 AM on 09/20/2017 -----------------------------------------  Patient sodium has improved to 131.  She is able to tolerate by mouth now without complaint.  In addition her potassium has dropped slightly to 3.1, will replete orally here and provided prescription for potassium for home use.  Patient does have a primary care doctor whom she can follow closely with, and I will have her call to schedule next available appointment for close follow-up.  Careful abdominal pain return precautions discussed the patient is in agreement  Return precautions and treatment recommendations and follow-up discussed with the patient who is agreeable with the plan.     Delman Kitten, MD 09/20/17 0100

## 2017-09-19 NOTE — ED Triage Notes (Signed)
Lower R abdominal pain and nausea, and diarrhea x 9 days.

## 2017-09-19 NOTE — ED Notes (Signed)
Pt to CT scan via stretcher.

## 2017-09-20 LAB — BASIC METABOLIC PANEL
Anion gap: 8 (ref 5–15)
BUN: 12 mg/dL (ref 6–20)
CALCIUM: 7.8 mg/dL — AB (ref 8.9–10.3)
CHLORIDE: 100 mmol/L — AB (ref 101–111)
CO2: 23 mmol/L (ref 22–32)
CREATININE: 0.82 mg/dL (ref 0.44–1.00)
GFR calc non Af Amer: >60 mL/min (ref >60)
Glucose, Bld: 95 mg/dL (ref 65–99)
Potassium: 3.1 mmol/L — ABNORMAL LOW (ref 3.5–5.1)
SODIUM: 131 mmol/L — AB (ref 135–145)

## 2017-09-20 MED ORDER — POTASSIUM CHLORIDE CRYS ER 20 MEQ PO TBCR
40.0000 meq | EXTENDED_RELEASE_TABLET | Freq: Once | ORAL | Status: AC
  Administered 2017-09-20: 40 meq via ORAL
  Filled 2017-09-20: qty 2

## 2017-09-20 MED ORDER — POTASSIUM CHLORIDE ER 10 MEQ PO TBCR: 10.0000 meq | EXTENDED_RELEASE_TABLET | Freq: Every day | ORAL | 0 refills | Status: AC

## 2017-09-20 MED ORDER — FAMOTIDINE 20 MG PO TABS
40.0000 mg | ORAL_TABLET | Freq: Once | ORAL | Status: AC
  Administered 2017-09-20: 40 mg via ORAL
  Filled 2017-09-20: qty 2

## 2017-09-20 MED ORDER — ONDANSETRON 4 MG PO TBDP: 4.0000 mg | ORAL_TABLET | Freq: Four times a day (QID) | ORAL | 0 refills | Status: DC | PRN

## 2017-09-20 NOTE — Discharge Instructions (Addendum)
Follow-up closely with your primary care doctor this week. I recommend you have your sodium and potassium labs rechecked within a couple days. (2-3 days)  Please return to the emergency room right away if you are to develop a fever, severe nausea, your pain becomes severe or worsens, you are unable to keep food down, begin vomiting any dark or bloody fluid, you develop any dark or bloody stools, feel dehydrated, or other new concerns or symptoms arise.

## 2017-09-21 LAB — URINE CULTURE
Culture: 40000 — AB
SPECIAL REQUESTS: NORMAL

## 2018-07-30 ENCOUNTER — Encounter: Payer: Self-pay | Admitting: *Deleted

## 2018-07-30 ENCOUNTER — Emergency Department
Admission: EM | Admit: 2018-07-30 | Discharge: 2018-07-30 | Disposition: A | Discharge status: Home / Self Care | Payer: Medicare Other | Attending: Emergency Medicine | Admitting: Emergency Medicine

## 2018-07-30 ENCOUNTER — Other Ambulatory Visit: Payer: Self-pay

## 2018-07-30 DIAGNOSIS — Z87891 Personal history of nicotine dependence: Secondary | ICD-10-CM | POA: Insufficient documentation

## 2018-07-30 DIAGNOSIS — Z79899 Other long term (current) drug therapy: Secondary | ICD-10-CM | POA: Insufficient documentation

## 2018-07-30 DIAGNOSIS — A0811 Acute gastroenteropathy due to Norwalk agent: Secondary | ICD-10-CM | POA: Diagnosis not present

## 2018-07-30 DIAGNOSIS — R197 Diarrhea, unspecified: Secondary | ICD-10-CM | POA: Diagnosis present

## 2018-07-30 LAB — URINALYSIS, COMPLETE (UACMP) WITH MICROSCOPIC
BILIRUBIN URINE: NEGATIVE
Bilirubin Urine: NEGATIVE
GLUCOSE, UA: NEGATIVE mg/dL
Glucose, UA: NEGATIVE mg/dL
KETONES UR: NEGATIVE mg/dL
Ketones, ur: NEGATIVE mg/dL
Leukocytes,Ua: NEGATIVE
Nitrite: POSITIVE — AB
Nitrite: NEGATIVE
PH: 7 (ref 5.0–8.0)
PROTEIN: NEGATIVE mg/dL
Protein, ur: NEGATIVE mg/dL
SPECIFIC GRAVITY, URINE: 1.008 (ref 1.005–1.030)
Specific Gravity, Urine: 1.003 — ABNORMAL LOW (ref 1.005–1.030)
Squamous Epithelial / HPF: NONE SEEN (ref 0–5)
pH: 7 (ref 5.0–8.0)

## 2018-07-30 LAB — GASTROINTESTINAL PANEL BY PCR, STOOL (REPLACES STOOL CULTURE)
Adenovirus F40/41: NOT DETECTED
Astrovirus: NOT DETECTED
CRYPTOSPORIDIUM: NOT DETECTED
CYCLOSPORA CAYETANENSIS: NOT DETECTED
Campylobacter species: NOT DETECTED
ENTAMOEBA HISTOLYTICA: NOT DETECTED
Enteroaggregative E coli (EAEC): NOT DETECTED
Enteropathogenic E coli (EPEC): NOT DETECTED
Enterotoxigenic E coli (ETEC): NOT DETECTED
Giardia lamblia: NOT DETECTED
Norovirus GI/GII: DETECTED — AB
Plesimonas shigelloides: NOT DETECTED
Rotavirus A: NOT DETECTED
Salmonella species: NOT DETECTED
Sapovirus (I, II, IV, and V): NOT DETECTED
Shiga like toxin producing E coli (STEC): NOT DETECTED
Shigella/Enteroinvasive E coli (EIEC): NOT DETECTED
VIBRIO CHOLERAE: NOT DETECTED
Vibrio species: NOT DETECTED
Yersinia enterocolitica: NOT DETECTED

## 2018-07-30 LAB — C DIFFICILE QUICK SCREEN W PCR REFLEX
C DIFFICILE (CDIFF) INTERP: NOT DETECTED
C DIFFICILE (CDIFF) TOXIN: NEGATIVE
C Diff antigen: NEGATIVE

## 2018-07-30 LAB — COMPREHENSIVE METABOLIC PANEL
ALK PHOS: 68 U/L (ref 38–126)
ALT: 19 U/L (ref 0–44)
ANION GAP: 7 (ref 5–15)
AST: 22 U/L (ref 15–41)
Albumin: 3.9 g/dL (ref 3.5–5.0)
BUN: 9 mg/dL (ref 8–23)
CALCIUM: 9.3 mg/dL (ref 8.9–10.3)
CO2: 26 mmol/L (ref 22–32)
CREATININE: 0.67 mg/dL (ref 0.44–1.00)
Chloride: 106 mmol/L (ref 98–111)
GFR calc Af Amer: >60 mL/min (ref >60)
Glucose, Bld: 103 mg/dL — ABNORMAL HIGH (ref 70–99)
Potassium: 4 mmol/L (ref 3.5–5.1)
SODIUM: 139 mmol/L (ref 135–145)
TOTAL PROTEIN: 6.9 g/dL (ref 6.5–8.1)
Total Bilirubin: 0.5 mg/dL (ref 0.3–1.2)

## 2018-07-30 LAB — CBC
HCT: 39.3 % (ref 36.0–46.0)
Hemoglobin: 12.8 g/dL (ref 12.0–15.0)
MCH: 28.8 pg (ref 26.0–34.0)
MCHC: 32.6 g/dL (ref 30.0–36.0)
MCV: 88.5 fL (ref 80.0–100.0)
NRBC: 0 % (ref 0.0–0.2)
PLATELETS: 478 10*3/uL — AB (ref 150–400)
RBC: 4.44 MIL/uL (ref 3.87–5.11)
RDW: 13.5 % (ref 11.5–15.5)
WBC: 5.9 10*3/uL (ref 4.0–10.5)

## 2018-07-30 LAB — LIPASE, BLOOD: Lipase: 28 U/L (ref 11–51)

## 2018-07-30 MED ORDER — SODIUM CHLORIDE 0.9 % IV BOLUS
1000.0000 mL | Freq: Once | INTRAVENOUS | Status: AC
  Administered 2018-07-30: 1000 mL via INTRAVENOUS

## 2018-07-30 MED ORDER — ONDANSETRON 4 MG PO TBDP: 4.0000 mg | ORAL_TABLET | Freq: Four times a day (QID) | ORAL | 0 refills | Status: AC | PRN

## 2018-07-30 MED ORDER — SODIUM CHLORIDE 0.9% FLUSH
3.0000 mL | Freq: Once | INTRAVENOUS | Status: AC
  Administered 2018-07-30: 3 mL via INTRAVENOUS

## 2018-07-30 MED ORDER — ACETAMINOPHEN 325 MG PO TABS
650.0000 mg | ORAL_TABLET | Freq: Once | ORAL | Status: AC
  Administered 2018-07-30: 650 mg via ORAL
  Filled 2018-07-30: qty 2

## 2018-07-30 MED ORDER — ONDANSETRON HCL 4 MG/2ML IJ SOLN
4.0000 mg | Freq: Once | INTRAMUSCULAR | Status: AC
  Administered 2018-07-30: 4 mg via INTRAVENOUS
  Filled 2018-07-30: qty 2

## 2018-07-30 NOTE — ED Provider Notes (Addendum)
[facility] Emergency Department Provider Note  ____________________________________________   I have reviewed the triage vital signs and the nursing notes. Where available I have reviewed prior notes and, if possible and indicated, outside hospital notes.    HISTORY  Chief Complaint Emesis; Diarrhea; and Headache    HPI Allison [name] is a 68 y.o. 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  Actually started with vomiting and diarrhea now the vomiting is better but she still has diarrhea.  Denies any fever or chills.  She does have a slight headache.  Is not worst headache of life headache itself is better.  When I talked to her about CT scan for headache she states she [place] does not want one.  She is here only because she is worried that her c difficile perhaps is recurred.  No recent antibiotics.  No bright red blood per rectum and has not had any melena or any vomiting actually for the last couple days.  No hematemesis.  Has significant abdominal discomfort.  Past Medical History:  Diagnosis Date  . Arrhythmia   . Heart murmur   . Mitral valve prolapse   . Syncope     There are no active problems to display for this patient.   Past Surgical History:  Procedure Laterality Date  . HERNIA REPAIR    . partial knee replacement     Left  . VAGINAL HYSTERECTOMY      Prior to Admission medications   Medication Sig Start Date End Date Taking? Authorizing Provider  amitriptyline (ELAVIL) 25 MG tablet Take 25 mg by mouth at bedtime.    [provider]  fludrocortisone (FLORINEF) 0.1 MG tablet Take 0.1 mg by mouth daily.    [provider]  meclizine (ANTIVERT) 25 MG tablet Take 1 tablet (25 mg total) by mouth 3 (three) times daily as needed for dizziness. 04/28/16   Nance Pear, MD  ondansetron (ZOFRAN ODT) 4 MG disintegrating tablet Take 1 tablet (4 mg total) by  mouth every 6 (six) hours as needed for nausea or vomiting. 09/20/17   Delman Kitten, MD  pentosan polysulfate (ELMIRON) 100 MG capsule Take 100 mg by mouth 2 (two) times daily.    [provider]  potassium chloride (K-DUR) 10 MEQ tablet Take 1 tablet (10 mEq total) by mouth daily. 09/20/17   Delman Kitten, MD    Allergies Amoxicillin and Sulfa antibiotics  Family History  Problem Relation Age of Onset  . Heart attack Father        cause of death.  . Hyperlipidemia Mother   . Dementia Mother   . Stroke Mother     Social History Social History   Tobacco Use  . Smoking status: Former Smoker    Last attempt to quit: 10/08/1988    Years since quitting: 29.8  . Smokeless tobacco: Never Used  Substance Use Topics  . Alcohol use: Yes    Comment: wine or beer one a day.   . Drug use: Not on file    Review of Systems Constitutional: No fever/chills Eyes: No visual changes. ENT: No sore throat. No stiff neck no neck pain Cardiovascular: Denies chest pain. Respiratory: Denies shortness of breath. Gastrointestinal:   See HPI Genitourinary: Negative for dysuria. Musculoskeletal: Negative lower extremity swelling Skin: Negative for rash. Neurological: Negative for severe headaches, focal weakness or numbness.   ____________________________________________   PHYSICAL EXAM:  VITAL SIGNS:  ED Triage Vitals  Enc Vitals Group     BP 07/30/18 1216 134/77     Pulse Rate 07/30/18 1216 65     Resp 07/30/18 1216 16     Temp 07/30/18 1216 98.7 F (37.1 C)     Temp Source 07/30/18 1216 Oral     SpO2 07/30/18 1216 100 %     Weight 07/30/18 1216 140 lb (63.5 kg)     Height 07/30/18 1216 5\' 7"  (1.702 m)     Head Circumference --      Peak Flow --      Pain Score 07/30/18 1224 6     Pain Loc --      Pain Edu? --      Excl. in [place]? --     Constitutional: Alert and oriented. Well appearing and in no acute distress. Eyes: Conjunctivae are normal Head: Atraumatic HEENT: No  congestion/rhinnorhea. Mucous membranes are moist.  Oropharynx non-erythematous Neck:   Nontender with no meningismus, no masses, no stridor Cardiovascular: Normal rate, regular rhythm. Grossly normal heart sounds.  Good peripheral circulation. Respiratory: Normal respiratory effort.  No retractions. Lungs CTAB. Abdominal: Soft and nontender. No distention. No guarding no rebound Back:  There is no focal tenderness or step off.  there is no midline tenderness there are no lesions noted. there is no CVA tenderness Musculoskeletal: No lower extremity tenderness, no upper extremity tenderness. No joint effusions, no DVT signs strong distal pulses no edema Neurologic:  Normal speech and language. No gross focal neurologic deficits are appreciated.  Skin:  Skin is warm, dry and intact. No rash noted. Psychiatric: Mood and affect are normal. Speech and behavior are normal.  ____________________________________________   LABS (all labs ordered are listed, but only abnormal results are displayed)  Labs Reviewed  COMPREHENSIVE METABOLIC PANEL - Abnormal; Notable for the following components:      Result Value   Glucose, Bld 103 (*)    All other components within normal limits  CBC - Abnormal; Notable for the following components:   Platelets 478 (*)    All other components within normal limits  URINALYSIS, COMPLETE (UACMP) WITH MICROSCOPIC - Abnormal; Notable for the following components:   Color, Urine YELLOW (*)    APPearance CLEAR (*)    Hgb urine dipstick MODERATE (*)    Nitrite POSITIVE (*)    Leukocytes,Ua SMALL (*)    Bacteria, UA FEW (*)    All other components within normal limits  C DIFFICILE QUICK SCREEN W PCR REFLEX  GASTROINTESTINAL PANEL BY PCR, STOOL (REPLACES STOOL CULTURE)  URINE CULTURE  LIPASE, BLOOD  URINALYSIS, COMPLETE (UACMP) WITH MICROSCOPIC    Pertinent labs  results that were available during my care of the patient were reviewed by me and considered in my  medical decision making (see chart for details). ____________________________________________  EKG  I personally interpreted any EKGs ordered by me or triage  ____________________________________________  RADIOLOGY  Pertinent labs & imaging results that were available during my care of the patient were reviewed by me and considered in my medical decision making (see chart for details). If possible, patient and/or family made aware of any abnormal findings.  No results found. ____________________________________________    PROCEDURES  Procedure(s) performed: None  Procedures  Critical Care performed: None  ____________________________________________   INITIAL IMPRESSION / ASSESSMENT AND PLAN / ED COURSE  Pertinent labs & imaging results that were available during my care of the patient were reviewed by me and considered in  my medical decision making (see chart for details).  Patient here with diarrhea which has been ongoing for couple days.  Vital signs are reassuring.  She has a slight headache but she refuses CT scan or further intervention aside from Tylenol.  She understands the limits of my ability to evaluate headache without imaging at her age but she is neurologically intact and does not appear to have any significant headache syndrome going on at least to the extent that I can determine.  Nonetheless, we did offer CT and she declined and again I feel that she has a capacity to make that choice.  The concern for this patient is a recurrence of her C. difficile.  We are sending stool cultures, giving her IV fluid, and we will do an in and out cath urine as her initial urine looked like it might be infected but I suspect it was contaminated.  We also run the culture off the new urine.  Signed out at the end of my shift.  ----------------------------------------- 3:13 PM on 07/30/2018 -----------------------------------------  Signed out to dr. Jacqualine Code at the end of my  shift.    ____________________________________________   FINAL CLINICAL IMPRESSION(S) / ED DIAGNOSES  Final diagnoses:  None      This chart was dictated using voice recognition software.  Despite best efforts to proofread,  errors can occur which can change meaning.      Schuyler Amor, MD 07/30/18 1450    Schuyler Amor, MD 07/30/18 [id number]

## 2018-07-30 NOTE — ED Notes (Signed)
..  This tech in pt room at this time to check on pt, pt with no concerns at this time, pt with bed lowered, call light in reach, pt offered a warm blanket and accepted, pt not needing to use the restroom or be changed at this time, pt reminded that if a need arises the call light is within reach and to call for help. This tech will continue to monitor pt from monitors at nursing station and will continue to check in with pt hourly

## 2018-07-30 NOTE — ED Triage Notes (Signed)
Pt to ED reporting vomiting that started on Wednesday and has since dissipated. PT verbalized concerns for diarrhea. 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 Nausea continues at this time with a headache throughout the week. Decreased PO intake.

## 2018-07-30 NOTE — ED Notes (Signed)
Date and time results received: 07/30/18 4:10 PM (use smartphrase ".now" to insert current time)  Test: GI Panel Critical Value: Noro GI/GII  Name of Provider Notified: Dr. Jacqualine Code  Orders Received? Or Actions Taken?: Orders Received - See Orders for details

## 2018-07-30 NOTE — ED Notes (Signed)
Pt in and out cath by this RN and Karl Bales, Therapist, sports. Pt tolerated well. UA and UC sent to lab.

## 2018-07-30 NOTE — ED Notes (Signed)
EDP at bedside at this time.  

## 2018-07-30 NOTE — ED Triage Notes (Signed)
Vomiting and diarrhea and headache.

## 2018-07-30 NOTE — ED Provider Notes (Signed)
Vitals:   07/30/18 1630 07/30/18 1700  BP: 131/86 128/86  Pulse: (!) 59 (!) 59  Resp:    Temp:    SpO2: 97% 99%     Patient is resting comfortably she feels well.  Her vital signs are normal.  She is in no distress.  She reports she feels much better no further nausea and vomiting.  Test results positive for norovirus which appears to explain symptoms well.  Denies urinary symptoms.  Urine culture has been sent.  Return precautions and treatment recommendations and follow-up discussed with the patient who is agreeable with the plan.    Delman Kitten, MD 07/30/18 1735

## 2018-07-30 NOTE — ED Notes (Signed)
NAD noted at time of D/C. Pt denies questions or concerns. Pt ambulatory to the lobby at this time.  

## 2018-08-02 LAB — URINE CULTURE: Culture: >=100000 — AB

## 2018-08-03 NOTE — Progress Notes (Signed)
ED Antimicrobial Stewardship Positive Culture Follow Up   Allison [name] is an 68 y.o. female who presented to [facility] on 07/30/2018 with a chief complaint of  Chief Complaint  Patient presents with  . Emesis  . Diarrhea  . Headache    Recent Results (from the past 720 hour(s))  C difficile quick scan w PCR reflex     Status: None   Collection Time: 07/30/18  1:21 PM  Result Value Ref Range Status   C Diff antigen NEGATIVE NEGATIVE Final   C Diff toxin NEGATIVE NEGATIVE Final   C Diff interpretation No C. difficile detected.  Final    Comment: Performed at [facility], [address] [place], [place] [address]  Gastrointestinal Panel by PCR , Stool     Status: Abnormal   Collection Time: 07/30/18  1:21 PM  Result Value Ref Range Status   Campylobacter species NOT DETECTED NOT DETECTED Final   Plesimonas shigelloides NOT DETECTED NOT DETECTED Final   Salmonella species NOT DETECTED NOT DETECTED Final   Yersinia enterocolitica NOT DETECTED NOT DETECTED Final   Vibrio species NOT DETECTED NOT DETECTED Final   Vibrio cholerae NOT DETECTED NOT DETECTED Final   Enteroaggregative E coli (EAEC) NOT DETECTED NOT DETECTED Final   Enteropathogenic E coli (EPEC) NOT DETECTED NOT DETECTED Final   Enterotoxigenic E coli (ETEC) NOT DETECTED NOT DETECTED Final   Shiga like toxin producing E coli (STEC) NOT DETECTED NOT DETECTED Final   Shigella/Enteroinvasive E coli (EIEC) NOT DETECTED NOT DETECTED Final   Cryptosporidium NOT DETECTED NOT DETECTED Final   Cyclospora cayetanensis NOT DETECTED NOT DETECTED Final   Entamoeba histolytica NOT DETECTED NOT DETECTED Final   Giardia lamblia NOT DETECTED NOT DETECTED Final   Adenovirus F40/41 NOT DETECTED NOT DETECTED Final   Astrovirus NOT DETECTED NOT DETECTED Final   Norovirus GI/GII DETECTED (A) NOT DETECTED Final    Comment: CRITICAL RESULT CALLED TO, READ BACK BY AND VERIFIED WITH: CALLED TO Allison [name] @1601  07/30/2018 SAC     Rotavirus A NOT DETECTED NOT DETECTED Final   Sapovirus (I, II, IV, and V) NOT DETECTED NOT DETECTED Final    Comment: Performed at [facility], [address] [place], [place] [address]  Urine culture     Status: Abnormal   Collection Time: 07/30/18  2:23 PM  Result Value Ref Range Status   Specimen Description   Final    URINE, RANDOM Performed at [facility], [address] [place], [place] [address]    Special Requests   Final    NONE Performed at [facility], [address] [place], [place] [address]    Culture >=100,000 COLONIES/mL ESCHERICHIA COLI (A)  Final   Report Status 08/02/2018 FINAL  Final   Organism ID, Bacteria ESCHERICHIA COLI (A)  Final      Susceptibility   Escherichia coli - MIC*    AMPICILLIN 4 SENSITIVE Sensitive     CEFAZOLIN <=4 SENSITIVE Sensitive     CEFTRIAXONE <=1 SENSITIVE Sensitive     CIPROFLOXACIN <=0.25 SENSITIVE Sensitive     GENTAMICIN <=1 SENSITIVE Sensitive     IMIPENEM <=0.25 SENSITIVE Sensitive     NITROFURANTOIN <=16 SENSITIVE Sensitive     TRIMETH/SULFA <=20 SENSITIVE Sensitive     AMPICILLIN/SULBACTAM <=2 SENSITIVE Sensitive     PIP/TAZO <=4 SENSITIVE Sensitive     Extended ESBL NEGATIVE Sensitive     * >=100,000 COLONIES/mL ESCHERICHIA COLI    []  Treated  with, organism resistant to prescribed antimicrobial [x]  Patient discharged originally without antimicrobial agent and treatment is now indicated  New antibiotic prescription: Cephalexin 500 mg 3 times a day x 7 days(Patient declined)  ED Provider: Dr. Alfred Levins  Patient lives at home and was contacted regarding positive ED culture. Informed patient that her culture grew positive for E. Coli and that it was recommended to start on antibiotic therapy. Patient has had C.diff in previous encounter and stated that she believed her symptoms were due to her testing positive for Norovirus. Patient is reluctant to take antibiotics unless absolutely  necessary due to her previous C.diff diagnosis. She stated she feels much better and has had no problems urinating nor felt any burning while urinating. Patient requested to not send prescription to retail pharmacy.   Allison [name] 08/03/2018, 8:06 PM Clinical Pharmacist Monday - Friday phone -  [phone] Saturday - Sunday phone - [phone]

## 2019-02-07 ENCOUNTER — Other Ambulatory Visit: Payer: Self-pay | Admitting: Family Medicine

## 2019-02-07 DIAGNOSIS — Z1231 Encounter for screening mammogram for malignant neoplasm of breast: Secondary | ICD-10-CM

## 2019-02-07 DIAGNOSIS — Z780 Asymptomatic menopausal state: Secondary | ICD-10-CM

## 2019-07-13 ENCOUNTER — Other Ambulatory Visit: Payer: Self-pay

## 2019-07-13 ENCOUNTER — Emergency Department: Payer: Medicare PPO

## 2019-07-13 ENCOUNTER — Emergency Department
Admission: EM | Admit: 2019-07-13 | Discharge: 2019-07-13 | Disposition: A | Discharge status: Home / Self Care | Payer: Medicare PPO | Attending: Student | Admitting: Student

## 2019-07-13 DIAGNOSIS — T50905A Adverse effect of unspecified drugs, medicaments and biological substances, initial encounter: Secondary | ICD-10-CM

## 2019-07-13 DIAGNOSIS — Z96652 Presence of left artificial knee joint: Secondary | ICD-10-CM | POA: Diagnosis not present

## 2019-07-13 DIAGNOSIS — Z20822 Contact with and (suspected) exposure to covid-19: Secondary | ICD-10-CM | POA: Insufficient documentation

## 2019-07-13 DIAGNOSIS — Z87891 Personal history of nicotine dependence: Secondary | ICD-10-CM | POA: Insufficient documentation

## 2019-07-13 DIAGNOSIS — Z79899 Other long term (current) drug therapy: Secondary | ICD-10-CM | POA: Insufficient documentation

## 2019-07-13 DIAGNOSIS — R531 Weakness: Secondary | ICD-10-CM | POA: Diagnosis not present

## 2019-07-13 DIAGNOSIS — R5383 Other fatigue: Secondary | ICD-10-CM | POA: Diagnosis present

## 2019-07-13 LAB — CBC
HCT: 39 % (ref 36.0–46.0)
Hemoglobin: 12.9 g/dL (ref 12.0–15.0)
MCH: 27.8 pg (ref 26.0–34.0)
MCHC: 33.1 g/dL (ref 30.0–36.0)
MCV: 84.1 fL (ref 80.0–100.0)
Platelets: 586 10*3/uL — ABNORMAL HIGH (ref 150–400)
RBC: 4.64 MIL/uL (ref 3.87–5.11)
RDW: 13.6 % (ref 11.5–15.5)
WBC: 17.4 10*3/uL — ABNORMAL HIGH (ref 4.0–10.5)
nRBC: 0 % (ref 0.0–0.2)

## 2019-07-13 LAB — TROPONIN I (HIGH SENSITIVITY): Troponin I (High Sensitivity): 4 ng/L (ref <18)

## 2019-07-13 LAB — BASIC METABOLIC PANEL
Anion gap: 10 (ref 5–15)
BUN: 17 mg/dL (ref 8–23)
CO2: 22 mmol/L (ref 22–32)
Calcium: 9.5 mg/dL (ref 8.9–10.3)
Chloride: 103 mmol/L (ref 98–111)
Creatinine, Ser: 0.89 mg/dL (ref 0.44–1.00)
GFR calc Af Amer: >60 mL/min (ref >60)
GFR calc non Af Amer: >60 mL/min (ref >60)
Glucose, Bld: 115 mg/dL — ABNORMAL HIGH (ref 70–99)
Potassium: 4.1 mmol/L (ref 3.5–5.1)
Sodium: 135 mmol/L (ref 135–145)

## 2019-07-13 MED ORDER — AZITHROMYCIN 500 MG PO TABS
500.0000 mg | ORAL_TABLET | Freq: Once | ORAL | Status: AC
  Administered 2019-07-13: 500 mg via ORAL
  Filled 2019-07-13: qty 1

## 2019-07-13 MED ORDER — SODIUM CHLORIDE 0.9% FLUSH: 3.0000 mL | Freq: Once | INTRAVENOUS | Status: DC

## 2019-07-13 MED ORDER — AZITHROMYCIN 250 MG PO TABS: 250.0000 mg | ORAL_TABLET | Freq: Every day | ORAL | 0 refills | Status: AC

## 2019-07-13 NOTE — ED Provider Notes (Signed)
[facility] Emergency Department Provider Note  ____________________________________________   First MD Initiated Contact with Patient 07/13/19 1458     (approximate)  I have reviewed the triage vital signs and the nursing notes.  History  Chief Complaint Shortness of Breath and Chest Pain    HPI Allison [name] is a 69 y.o. female with hx of essential tremor, interstitial cystitis, who presents with concern for possible medication reaction. Patient states she initially developed generalized fatigue, tiredness on Friday.  Presented to urgent care on Monday, where she had a chest x-ray done which revealed pneumonia.  Also had 15-minute antigen testing for COVID which was negative.  She was started on doxycycline, inhaler, prednisone, and promethazine.  She states all of these medications are new for her.  Today, this morning she felt like she had some swelling and redness of her face/cheeks as well as chest discomfort.  She denies any lip or tongue swelling.  No difficulty speaking or swallowing.  No rashes, hives, or vomiting.  The swelling and redness has improved since onset, but she feels a small amount is still present.  With regards to her chest discomfort, this is located at the center part of her chest, difficult to describe, no radiation, no alleviating or aggravating components.  She denies any COVID exposures, however her PCP recommended retesting with PCR given her generalized fatigue and weakness symptoms, and her UC testing was with rapid antigen.    She denies any changes to her urinary habits - she does have a history of interstitial cystitis and therefore always has some mild dysuria at baseline, but denies any acute changes with this.  She has had some mild loose stools since initiating her antibiotics.   Past Medical Hx Past Medical History:  Diagnosis Date  . Arrhythmia   . Heart murmur   . Mitral valve prolapse   . Syncope      Problem List There are no problems to display for this patient.   Past Surgical Hx Past Surgical History:  Procedure Laterality Date  . HERNIA REPAIR    . partial knee replacement     Left  . VAGINAL HYSTERECTOMY      Medications Prior to Admission medications   Medication Sig Start Date End Date Taking? Authorizing Provider  amitriptyline (ELAVIL) 25 MG tablet Take 25 mg by mouth at bedtime.    [provider]  fludrocortisone (FLORINEF) 0.1 MG tablet Take 0.1 mg by mouth daily.    [provider]  meclizine (ANTIVERT) 25 MG tablet Take 1 tablet (25 mg total) by mouth 3 (three) times daily as needed for dizziness. 04/28/16   Nance Pear, MD  ondansetron (ZOFRAN ODT) 4 MG disintegrating tablet Take 1 tablet (4 mg total) by mouth every 6 (six) hours as needed for nausea or vomiting. 07/30/18   Delman Kitten, MD  pentosan polysulfate (ELMIRON) 100 MG capsule Take 100 mg by mouth 2 (two) times daily.    [provider]  potassium chloride (K-DUR) 10 MEQ tablet Take 1 tablet (10 mEq total) by mouth daily. 09/20/17   Delman Kitten, MD    Allergies Amoxicillin and Sulfa antibiotics  Family Hx Family History  Problem Relation Age of Onset  . Heart attack Father        cause of death.  . Hyperlipidemia Mother   . Dementia Mother   . Stroke Mother     Social Hx Social History   Tobacco Use  . Smoking status:  Former Smoker    Quit date: 10/08/1988    Years since quitting: 30.7  . Smokeless tobacco: Never Used  Substance Use Topics  . Alcohol use: Yes    Comment: wine or beer one a day.   . Drug use: Not on file     Review of Systems  Constitutional: Negative for fever, chills. + facial swelling/redness Eyes: Negative for visual changes. ENT: Negative for sore throat. Cardiovascular: Negative for chest pain. Respiratory: Negative for shortness of breath. Gastrointestinal: Negative for nausea, vomiting. + for loose stool Genitourinary:  Negative for dysuria. Musculoskeletal: Negative for leg swelling. Skin: Negative for rash. Neurological: Negative for headaches.   Physical Exam  Vital Signs: ED Triage Vitals  Enc Vitals Group     BP 07/13/19 1334 (!) 160/80     Pulse Rate 07/13/19 1334 64     Resp 07/13/19 1334 18     Temp 07/13/19 1334 98.3 F (36.8 C)     Temp Source 07/13/19 1334 Oral     SpO2 07/13/19 1334 100 %     Weight 07/13/19 1215 140 lb (63.5 kg)     Height 07/13/19 1215 5\' 7"  (1.702 m)     Head Circumference --      Peak Flow --      Pain Score 07/13/19 1215 6     Pain Loc --      Pain Edu? --      Excl. in [place]? --     Constitutional: Alert and oriented.  Head: Normocephalic. Atraumatic. Cheeks very minimally flushed, no appreciable facial swelling or edema or asymmetry. Sinuses non-tender. Mid face stable.  Eyes: Conjunctivae clear. Sclera anicteric. Nose: No congestion. No rhinorrhea. Mouth/Throat: Wearing mask.  No lip, tongue, or uvular swelling.  No voice changes. Neck: No stridor.  No lymphadenopathy. Cardiovascular: Normal rate, regular rhythm. Extremities well perfused. Respiratory: Normal respiratory effort.  Lungs CTAB. Gastrointestinal: Soft. Non-tender. Non-distended.  Musculoskeletal: No lower extremity edema. No deformities. Neurologic:  Normal speech and language. No gross focal neurologic deficits are appreciated.  Skin: Skin is warm, dry and intact. No rash noted. No urticaria or hives.  Psychiatric: Mood and affect are appropriate for situation.  EKG  Personally reviewed.   Rate: 63 Rhythm: sinus Axis: normal Intervals: WNL No acute ischemic changes No STEMI    Radiology  CXR: IMPRESSION:  No evidence of acute cardiopulmonary abnormality.    Procedures  Procedure(s) performed (including critical care):  Procedures   Initial Impression / Assessment and Plan / ED Course  69 y.o. female who presents to the ED for facial swelling/redness, chest  discomfort, concern for possible medication reaction.  Ddx: medication reaction, allergy, pleurisy, atypical ACS. With regards to her initial concern for generalized fatigue, consider PNA, COVID, UTI.   Will evaluate with labs, EKG, imaging. Exam is reassuring with no appreciable facial swelling and minimal, if any, flushing of her cheeks. No evidence of allergic reaction and certainly no evidence of anaphylaxis.   Discussed evaluating urine as possible source, however patient states her urine symptoms are consistent with her interstitial cystitis and she has had no acute changes and would like to defer any urine testing at this time.  Work-up without actionable derangements.  Leukocytosis on CBC likely reactive in the setting of her steroids.  Troponin negative.  CXR negative for pneumonia.  No masses or lesions that would contribute to an SVC type syndrome.  Given concern symptoms may be related to the initiation of multiple new medications,  advised cessation of all the new medicines that she started.  She states she has tolerated a Z-Pak in the past, will therefore have her complete a course of azithromycin for previously diagnosed pneumonia given she is in the middle of an antibiotic course.  COVID swab sent and pending, she understands results will return in 6 to 24 hours and she will receive a phone call for positive results.  Advised PCP follow-up and given return precautions.  Patient voices understanding and is comfortable to plan and discharge.  Final Clinical Impression(s) / ED Diagnosis  Final diagnoses:  Medication reaction, initial encounter  Generalized weakness       Note:  This document was prepared using Dragon voice recognition software and may include unintentional dictation errors.   Lilia Pro., MD 07/13/19 [phone]

## 2019-07-13 NOTE — Discharge Instructions (Signed)
Thank you for letting us take care of you in the emergency department today.   Please stop ALL the medications given to you by urgent care, as we will be unable to determine which one you may have had a reaction to.  You were swabbed for COVID today, the results should come back in 6 to 24 hours.  If they are positive, you will receive a phone call for this.  In the meantime, please take appropriate precautions while waiting for your results, including social distancing, mask wearing, quarantining as possible.  New medications we have prescribed:  - Azithromycin, please take as directed and complete the course for treatment of your pneumonia.  Please follow up with: - Your primary care doctor to review your ER visit and follow up on your symptoms.  If you continue to have diarrhea/loose stools they may pursue stool studies.  Please return to the ER for any new or worsening symptoms.

## 2019-07-13 NOTE — ED Triage Notes (Signed)
Pt states she was sent from the urgent care. States she was seen there and dx with pneumonia on Monday and given, inhaler, doxycycline, prednisone and promethazine . States she had her first covid vaccine last Wednesday and today is having chest pain and redness and swelling to her face.

## 2019-07-14 LAB — SARS CORONAVIRUS 2 (TAT 6-24 HRS): SARS Coronavirus 2: NEGATIVE

## 2019-08-19 ENCOUNTER — Emergency Department: Payer: Medicare PPO

## 2019-08-19 ENCOUNTER — Other Ambulatory Visit: Payer: Self-pay

## 2019-08-19 ENCOUNTER — Emergency Department
Admission: EM | Admit: 2019-08-19 | Discharge: 2019-08-19 | Disposition: A | Discharge status: Home / Self Care | Payer: Medicare PPO | Attending: Emergency Medicine | Admitting: Emergency Medicine

## 2019-08-19 ENCOUNTER — Encounter: Payer: Self-pay | Admitting: Emergency Medicine

## 2019-08-19 DIAGNOSIS — Z79899 Other long term (current) drug therapy: Secondary | ICD-10-CM | POA: Insufficient documentation

## 2019-08-19 DIAGNOSIS — Z96652 Presence of left artificial knee joint: Secondary | ICD-10-CM | POA: Insufficient documentation

## 2019-08-19 DIAGNOSIS — Y9389 Activity, other specified: Secondary | ICD-10-CM | POA: Insufficient documentation

## 2019-08-19 DIAGNOSIS — S52532A Colles' fracture of left radius, initial encounter for closed fracture: Secondary | ICD-10-CM | POA: Insufficient documentation

## 2019-08-19 DIAGNOSIS — W108XXA Fall (on) (from) other stairs and steps, initial encounter: Secondary | ICD-10-CM | POA: Insufficient documentation

## 2019-08-19 DIAGNOSIS — S0990XA Unspecified injury of head, initial encounter: Secondary | ICD-10-CM | POA: Diagnosis not present

## 2019-08-19 DIAGNOSIS — Z87891 Personal history of nicotine dependence: Secondary | ICD-10-CM | POA: Insufficient documentation

## 2019-08-19 DIAGNOSIS — S12201A Unspecified nondisplaced fracture of third cervical vertebra, initial encounter for closed fracture: Secondary | ICD-10-CM | POA: Diagnosis not present

## 2019-08-19 DIAGNOSIS — R0781 Pleurodynia: Secondary | ICD-10-CM | POA: Diagnosis not present

## 2019-08-19 DIAGNOSIS — Y999 Unspecified external cause status: Secondary | ICD-10-CM | POA: Diagnosis not present

## 2019-08-19 DIAGNOSIS — S0031XA Abrasion of nose, initial encounter: Secondary | ICD-10-CM | POA: Insufficient documentation

## 2019-08-19 DIAGNOSIS — Y92018 Other place in single-family (private) house as the place of occurrence of the external cause: Secondary | ICD-10-CM | POA: Insufficient documentation

## 2019-08-19 DIAGNOSIS — M542 Cervicalgia: Secondary | ICD-10-CM | POA: Diagnosis not present

## 2019-08-19 DIAGNOSIS — M79605 Pain in left leg: Secondary | ICD-10-CM | POA: Diagnosis not present

## 2019-08-19 DIAGNOSIS — S0081XA Abrasion of other part of head, initial encounter: Secondary | ICD-10-CM | POA: Insufficient documentation

## 2019-08-19 DIAGNOSIS — S6992XA Unspecified injury of left wrist, hand and finger(s), initial encounter: Secondary | ICD-10-CM | POA: Diagnosis present

## 2019-08-19 LAB — CBC
HCT: 40.6 % (ref 36.0–46.0)
Hemoglobin: 12.8 g/dL (ref 12.0–15.0)
MCH: 27.4 pg (ref 26.0–34.0)
MCHC: 31.5 g/dL (ref 30.0–36.0)
MCV: 86.9 fL (ref 80.0–100.0)
Platelets: 470 10*3/uL — ABNORMAL HIGH (ref 150–400)
RBC: 4.67 MIL/uL (ref 3.87–5.11)
RDW: 14.2 % (ref 11.5–15.5)
WBC: 11 10*3/uL — ABNORMAL HIGH (ref 4.0–10.5)
nRBC: 0 % (ref 0.0–0.2)

## 2019-08-19 LAB — BASIC METABOLIC PANEL
Anion gap: 8 (ref 5–15)
BUN: 19 mg/dL (ref 8–23)
CO2: 23 mmol/L (ref 22–32)
Calcium: 8.8 mg/dL — ABNORMAL LOW (ref 8.9–10.3)
Chloride: 108 mmol/L (ref 98–111)
Creatinine, Ser: 0.97 mg/dL (ref 0.44–1.00)
GFR calc Af Amer: >60 mL/min (ref >60)
GFR calc non Af Amer: >60 mL/min (ref >60)
Glucose, Bld: 87 mg/dL (ref 70–99)
Potassium: 3.8 mmol/L (ref 3.5–5.1)
Sodium: 139 mmol/L (ref 135–145)

## 2019-08-19 MED ORDER — FENTANYL CITRATE (PF) 100 MCG/2ML IJ SOLN
25.0000 ug | Freq: Once | INTRAMUSCULAR | Status: AC
  Administered 2019-08-19: 25 ug via INTRAVENOUS
  Filled 2019-08-19: qty 2

## 2019-08-19 MED ORDER — HYDROMORPHONE HCL 1 MG/ML IJ SOLN
0.5000 mg | Freq: Once | INTRAMUSCULAR | Status: AC
  Administered 2019-08-19: 20:00:00 0.5 mg via INTRAVENOUS
  Filled 2019-08-19: qty 1

## 2019-08-19 MED ORDER — HYDROMORPHONE HCL 1 MG/ML IJ SOLN
0.5000 mg | Freq: Once | INTRAMUSCULAR | Status: AC
  Administered 2019-08-19: 0.5 mg via INTRAVENOUS
  Filled 2019-08-19: qty 1

## 2019-08-19 MED ORDER — FENTANYL CITRATE (PF) 100 MCG/2ML IJ SOLN
25.0000 ug | Freq: Once | INTRAMUSCULAR | Status: AC
  Administered 2019-08-19: 15:00:00 25 ug via INTRAVENOUS
  Filled 2019-08-19: qty 2

## 2019-08-19 MED ORDER — HYDROCODONE-ACETAMINOPHEN 5-325 MG PO TABS
2.0000 | ORAL_TABLET | Freq: Once | ORAL | Status: AC
  Administered 2019-08-19: 2 via ORAL
  Filled 2019-08-19: qty 2

## 2019-08-19 MED ORDER — ONDANSETRON 4 MG PO TBDP: 4.0000 mg | ORAL_TABLET | Freq: Three times a day (TID) | ORAL | 0 refills | Status: AC | PRN

## 2019-08-19 MED ORDER — HYDROCODONE-ACETAMINOPHEN 5-325 MG PO TABS: 1.0000 | ORAL_TABLET | Freq: Four times a day (QID) | ORAL | 0 refills | Status: AC | PRN

## 2019-08-19 NOTE — ED Provider Notes (Signed)
Dr. Nikki Dom from [facility] recommends outpatient follow-up with cervical collar in place.  Patient is agreeable with this plan.   Earleen Newport, MD 08/19/19 2018

## 2019-08-19 NOTE — ED Provider Notes (Signed)
[facility] Emergency Department Provider Note  ____________________________________________  Time seen: Approximately 1:59 PM  I have reviewed the triage vital signs and the nursing notes.   HISTORY  Chief Complaint Fall    HPI Allison [name] is a 69 y.o. female that presents to the emergency department for evaluation of facial abrasions and left wrist pain following a fall today.  Patient was going up 2 steps at home while dying aster eggs with the kids when she tripped and fell.  She landed hitting the front of her face and used her left hand to brace her fall.  Patient had pain and swelling to her left wrist.  She is also sore to her right rib cage.  Patient did not lose consciousness.  She thinks she takes a blood thinner. Patient states that she has been up to walk to the bathroom since she has been here and did not have any pain to her low back or her hips.  Her neck is sore. No shortness of breath, chest pain, abdominal pain, hip pain.  No numbness, tingling.   Past Medical History:  Diagnosis Date  . Arrhythmia   . Heart murmur   . Mitral valve prolapse   . Syncope     There are no problems to display for this patient.   Past Surgical History:  Procedure Laterality Date  . HERNIA REPAIR    . partial knee replacement     Left  . VAGINAL HYSTERECTOMY      Prior to Admission medications   Medication Sig Start Date End Date Taking? Authorizing Provider  amitriptyline (ELAVIL) 25 MG tablet Take 25 mg by mouth at bedtime.    [provider]  fludrocortisone (FLORINEF) 0.1 MG tablet Take 0.1 mg by mouth daily.    [provider]  meclizine (ANTIVERT) 25 MG tablet Take 1 tablet (25 mg total) by mouth 3 (three) times daily as needed for dizziness. 04/28/16   Nance Pear, MD  ondansetron (ZOFRAN ODT) 4 MG disintegrating tablet Take 1 tablet (4 mg total) by mouth every 6 (six) hours as needed for nausea or vomiting. 07/30/18    Delman Kitten, MD  pentosan polysulfate (ELMIRON) 100 MG capsule Take 100 mg by mouth 2 (two) times daily.    [provider]  potassium chloride (K-DUR) 10 MEQ tablet Take 1 tablet (10 mEq total) by mouth daily. 09/20/17   Delman Kitten, MD    Allergies Amoxicillin and Sulfa antibiotics  Family History  Problem Relation Age of Onset  . Heart attack Father        cause of death.  . Hyperlipidemia Mother   . Dementia Mother   . Stroke Mother     Social History Social History   Tobacco Use  . Smoking status: Former Smoker    Quit date: 10/08/1988    Years since quitting: 30.8  . Smokeless tobacco: Never Used  Substance Use Topics  . Alcohol use: Yes    Comment: wine or beer one a day.   . Drug use: Not on file     Review of Systems  Cardiovascular: No chest pain. Respiratory: No SOB. Gastrointestinal: No abdominal pain.  No nausea, no vomiting.  Musculoskeletal: Positive for wrist pain. Skin: Negative for rash, lacerations, ecchymosis.  Positive for abrasions. Neurological: Negative for headaches, numbness or tingling   ____________________________________________   PHYSICAL EXAM:  VITAL SIGNS: ED Triage Vitals  Enc Vitals Group     BP 08/19/19 1319 (!)  170/98     Pulse Rate 08/19/19 1319 (!) 58     Resp 08/19/19 1319 18     Temp 08/19/19 1319 98.4 F (36.9 C)     Temp src --      SpO2 08/19/19 1319 100 %     Weight 08/19/19 1320 140 lb (63.5 kg)     Height 08/19/19 1320 5\' 7"  (1.702 m)     Head Circumference --      Peak Flow --      Pain Score 08/19/19 1320 8     Pain Loc --      Pain Edu? --      Excl. in [place]? --      Constitutional: Alert and oriented. Well appearing and in no acute distress. Eyes: Conjunctivae are normal. PERRL. EOMI. Head: Abrasions to central forehead and nose. ENT:      Ears:      Nose: No congestion/rhinnorhea.      Mouth/Throat: Mucous membranes are moist.  Neck: No stridor. No cervical spine tenderness to  palpation. Cardiovascular: Normal rate, regular rhythm.  Good peripheral circulation.  Symmetric radial pulses bilaterally. Respiratory: Normal respiratory effort without tachypnea or retractions. Lungs CTAB. Good air entry to the bases with no decreased or absent breath sounds. Gastrointestinal: Bowel sounds 4 quadrants. Soft and nontender to palpation. No guarding or rigidity. No palpable masses. No distention.  Musculoskeletal: Full range of motion to all extremities.  Minimal tenderness to palpation to right anterior rib cage.  Deformity to left wrist.  Full range of motion of bilateral hips. Neurologic:  Normal speech and language. No gross focal neurologic deficits are appreciated.  Skin:  Skin is warm, dry and intact. No rash noted.  No ecchymosis. Psychiatric: Mood and affect are normal. Speech and behavior are normal. Patient exhibits appropriate insight and judgement.   ____________________________________________   LABS (all labs ordered are listed, but only abnormal results are displayed)  Labs Reviewed  CBC  BASIC METABOLIC PANEL   ____________________________________________  EKG   ____________________________________________  RADIOLOGY Robinette Haines, personally viewed and evaluated these images (plain radiographs) as part of my medical decision making, as well as reviewing the written report by the radiologist.  DG Wrist Complete Left  Result Date: 08/19/2019 CLINICAL DATA:  Pain following fall EXAM: LEFT WRIST - COMPLETE 3+ VIEW COMPARISON:  None. FINDINGS: Frontal, oblique, and lateral views were obtained. There is a comminuted fracture of the distal radial metaphysis with dorsal angulation and displacement distally and impaction at the fracture site. No other fractures are evident. No dislocation. The space between the scaphoid and lunate is at the upper limits of normal. 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  IMPRESSION: Comminuted fracture distal radial metaphysis with dorsal angulation and displacement distally and impaction at the fracture site. No other fracture. No dislocation. Space between the scaphoid and lunate on the frontal view is upper normal. Clinical assessment for possible early scapholunate disassociation advised. 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 Electronically Signed   By: Lowella Grip III M.D.   On: 08/19/2019 13:37   CT Head Wo Contrast  Result Date: 08/19/2019 CLINICAL DATA:  69 year old female with a history of head trauma EXAM: CT HEAD WITHOUT CONTRAST CT MAXILLOFACIAL WITHOUT CONTRAST CT CERVICAL SPINE WITHOUT CONTRAST TECHNIQUE: Multidetector CT imaging of the head, cervical spine, and maxillofacial structures were performed using the standard protocol without intravenous contrast. Multiplanar CT image reconstructions of the cervical  spine and maxillofacial structures were also generated. COMPARISON:  None. FINDINGS: CT HEAD FINDINGS Brain: No acute intracranial hemorrhage. No midline shift or mass effect. Gray-white differentiation maintained. Unremarkable appearance of the ventricular system. Vascular: Intracranial atherosclerosis Skull: No acute fracture.  No aggressive bone lesion identified. Sinuses/Orbits: Unremarkable appearance of the orbits. Mastoid air cells clear. No middle ear effusion. No significant sinus disease. Other: None CT MAXILLOFACIAL FINDINGS Osseous: No fracture or mandibular dislocation. No destructive process. Orbits: Negative. No traumatic or inflammatory finding. Sinuses: Clear. Soft tissues: Negative. CT CERVICAL SPINE FINDINGS Alignment: Mild focal kyphosis centered at the C3-C4 level. There is associated minimal anterior translation of the C3 vertebral body measuring 2 mm-3 mm. The facets maintain alignment. Skull base and vertebrae: No fracture at the skull base. Nondisplaced inferior/anterior corner  fracture of the C3 vertebral body. No vertebral body height loss. No retropulsion of fracture fragments. Associated nondisplaced fracture of the left C3 facet without malalignment. Soft tissues and spinal canal: No canal hematoma. Unremarkable soft tissues of the cervical region Disc levels: Disc space narrowing throughout the cervical spine with early endplate changes. Mild right-sided foraminal narrowing at C3-C4, and C4-C5 secondary to facet disease and uncovertebral joint disease Upper chest: Unremarkable appearance of the lung apices. IMPRESSION: Head CT: No acute intracranial abnormality. Maxillofacial CT: Negative for acute bony abnormality. Cervical CT: Nondisplaced corner fracture of the anterior inferior C3 vertebral body with associated nondisplaced left C3 facet fracture. There is mild focal kyphosis at the site of the fracture with 2 mm-3 mm anterior translation of C3 on C4. Electronically Signed   By: Corrie Mckusick D.O.   On: 08/19/2019 14:45   CT Cervical Spine Wo Contrast  Result Date: 08/19/2019 CLINICAL DATA:  69 year old female with a history of head trauma EXAM: CT HEAD WITHOUT CONTRAST CT MAXILLOFACIAL WITHOUT CONTRAST CT CERVICAL SPINE WITHOUT CONTRAST TECHNIQUE: Multidetector CT imaging of the head, cervical spine, and maxillofacial structures were performed using the standard protocol without intravenous contrast. Multiplanar CT image reconstructions of the cervical spine and maxillofacial structures were also generated. COMPARISON:  None. FINDINGS: CT HEAD FINDINGS Brain: No acute intracranial hemorrhage. No midline shift or mass effect. Gray-white differentiation maintained. Unremarkable appearance of the ventricular system. Vascular: Intracranial atherosclerosis Skull: No acute fracture.  No aggressive bone lesion identified. Sinuses/Orbits: Unremarkable appearance of the orbits. Mastoid air cells clear. No middle ear effusion. No significant sinus disease. Other: None CT MAXILLOFACIAL  FINDINGS Osseous: No fracture or mandibular dislocation. No destructive process. Orbits: Negative. No traumatic or inflammatory finding. Sinuses: Clear. Soft tissues: Negative. CT CERVICAL SPINE FINDINGS Alignment: Mild focal kyphosis centered at the C3-C4 level. There is associated minimal anterior translation of the C3 vertebral body measuring 2 mm-3 mm. The facets maintain alignment. Skull base and vertebrae: No fracture at the skull base. Nondisplaced inferior/anterior corner fracture of the C3 vertebral body. No vertebral body height loss. No retropulsion of fracture fragments. Associated nondisplaced fracture of the left C3 facet without malalignment. Soft tissues and spinal canal: No canal hematoma. Unremarkable soft tissues of the cervical region Disc levels: Disc space narrowing throughout the cervical spine with early endplate changes. Mild right-sided foraminal narrowing at C3-C4, and C4-C5 secondary to facet disease and uncovertebral joint disease Upper chest: Unremarkable appearance of the lung apices. IMPRESSION: Head CT: No acute intracranial abnormality. Maxillofacial CT: Negative for acute bony abnormality. Cervical CT: Nondisplaced corner fracture of the anterior inferior C3 vertebral body with associated nondisplaced left C3 facet fracture. There is mild focal kyphosis  at the site of the fracture with 2 mm-3 mm anterior translation of C3 on C4. Electronically Signed   By: Corrie Mckusick D.O.   On: 08/19/2019 14:45   CT Maxillofacial Wo Contrast  Result Date: 08/19/2019 CLINICAL DATA:  69 year old female with a history of head trauma EXAM: CT HEAD WITHOUT CONTRAST CT MAXILLOFACIAL WITHOUT CONTRAST CT CERVICAL SPINE WITHOUT CONTRAST TECHNIQUE: Multidetector CT imaging of the head, cervical spine, and maxillofacial structures were performed using the standard protocol without intravenous contrast. Multiplanar CT image reconstructions of the cervical spine and maxillofacial structures were also  generated. COMPARISON:  None. FINDINGS: CT HEAD FINDINGS Brain: No acute intracranial hemorrhage. No midline shift or mass effect. Gray-white differentiation maintained. Unremarkable appearance of the ventricular system. Vascular: Intracranial atherosclerosis Skull: No acute fracture.  No aggressive bone lesion identified. Sinuses/Orbits: Unremarkable appearance of the orbits. Mastoid air cells clear. No middle ear effusion. No significant sinus disease. Other: None CT MAXILLOFACIAL FINDINGS Osseous: No fracture or mandibular dislocation. No destructive process. Orbits: Negative. No traumatic or inflammatory finding. Sinuses: Clear. Soft tissues: Negative. CT CERVICAL SPINE FINDINGS Alignment: Mild focal kyphosis centered at the C3-C4 level. There is associated minimal anterior translation of the C3 vertebral body measuring 2 mm-3 mm. The facets maintain alignment. Skull base and vertebrae: No fracture at the skull base. Nondisplaced inferior/anterior corner fracture of the C3 vertebral body. No vertebral body height loss. No retropulsion of fracture fragments. Associated nondisplaced fracture of the left C3 facet without malalignment. Soft tissues and spinal canal: No canal hematoma. Unremarkable soft tissues of the cervical region Disc levels: Disc space narrowing throughout the cervical spine with early endplate changes. Mild right-sided foraminal narrowing at C3-C4, and C4-C5 secondary to facet disease and uncovertebral joint disease Upper chest: Unremarkable appearance of the lung apices. IMPRESSION: Head CT: No acute intracranial abnormality. Maxillofacial CT: Negative for acute bony abnormality. Cervical CT: Nondisplaced corner fracture of the anterior inferior C3 vertebral body with associated nondisplaced left C3 facet fracture. There is mild focal kyphosis at the site of the fracture with 2 mm-3 mm anterior translation of C3 on C4. Electronically Signed   By: Corrie Mckusick D.O.   On: 08/19/2019 14:45     ____________________________________________    PROCEDURES  Procedure(s) performed:    Procedures    Medications  fentaNYL (SUBLIMAZE) injection 25 mcg (25 mcg Intravenous Given 08/19/19 1430)  fentaNYL (SUBLIMAZE) injection 25 mcg (25 mcg Intravenous Given 08/19/19 1516)     ____________________________________________   INITIAL IMPRESSION / ASSESSMENT AND PLAN / ED COURSE  Pertinent labs & imaging results that were available during my care of the patient were reviewed by me and considered in my medical decision making (see chart for details).  Review of the [place] CSRS was performed in accordance of the [place] prior to dispensing any controlled drugs.   Patient presented to emergency department post mechanical fall.  CT scans of the head, cervical, maxillofacial and wrist x-ray were ordered to further evaluate.  Patient states that she is on a blood thinner.  However I do not see a blood thinner listed on her medications and she is unable to give me a name.  Patient was given fentanyl for pain for her wrist.  ----------------------------------------- 2:00 PM on 08/19/2019 -----------------------------------------  Wrist x-ray shows a comminuted fracture of the distal radial metaphysis with dorsal angulation and displacement distally and impaction at the fracture site and questions possible scapholunate dissociation.  Case was discussed with Dr. Rudene Christians.  He does not  recommend any intervention in the ED at this time.   He recommends that patient be placed in a volar splint and to follow-up with him in clinic on Monday with plans for surgery on Tuesday. Patient is neurovascularly intact.   ----------------------------------------- 2:45 PM on 08/19/2019 -----------------------------------------  Toy Cookey wrist splint was placed.  CT scan of the head and cervical spine are negative for acute abnormalities.  CT scan of the cervical spine shows a nondisplaced corner fracture of the  anterior inferior C3 vertebral body with associated nondisplaced left C3 facet fracture.  Patient continues to deny any significant neck pain.  C-collar in place.  ----------------------------------------- 3:00 PM on 08/19/2019 -----------------------------------------  Case was discussed with Dr. Corky Downs, who recommended consult with neurosurgery at [facility], as we do not have a neurosurgeon on call.  ----------------------------------------- 3:30 PM on 08/19/2019 -----------------------------------------  Neurosurgery at [facility] was paged.  I spoke with Dr. Nikki Dom with [place] [facility].  He recommends an MRI to further evaluate C3 facet fracture.  Dr. Nikki Dom will follow-up after MRI results.  ----------------------------------------- 3:53 PM on 08/19/2019 -----------------------------------------  Case was discussed with Dr. Jimmye Norman.   At this time, CT head and maxillofacial are negative for acute abnormalities.  CT scans of the chest and pelvis are not processed.  He will continue care for Mrs. Hilda Blades while awaiting CT scans, MRI and to reevaluate.  Message was sent to Dr. Rudene Christians updating of patient's CT results.  Patient was transferred to the main side of the emergency department for further evaluation and care with Dr. Jimmye Norman.     ____________________________________________  FINAL CLINICAL IMPRESSION(S) / ED DIAGNOSES  Final diagnoses:  None      NEW MEDICATIONS STARTED DURING THIS VISIT:  ED Discharge Orders    None          This chart was dictated using voice recognition software/Dragon. Despite best efforts to proofread, errors can occur which can change the meaning. Any change was purely unintentional.    Laban Emperor, PA-C 08/19/19 1620    Earleen Newport, MD 08/19/19 1730

## 2019-08-19 NOTE — ED Notes (Signed)
Pt transported to MRI 

## 2019-08-19 NOTE — ED Triage Notes (Signed)
Pt to ER via EMS from home after tripping on stairs and falling forward.  Pt has noted deformity to left wrist.  Pt with abrasions to nose and forehead.  Pt takes blood thinners, denies LOC, dizziness or other c/o.  Pt states pain to neck with movement, denies pain with palpation.  Pt also c/o left leg pain.

## 2019-08-19 NOTE — ED Notes (Signed)
Pt presents following a mechanical fall at home shortly pta. Pt has abrasions to central forehead and nose. Pt injured lt wrist and hand bracing the fall. Pt denies LOC.

## 2019-08-19 NOTE — ED Provider Notes (Signed)
IMPRESSION: Probable focal disruption of the anterior longitudinal ligament associated with C3 endplate corner fracture. Otherwise, no evidence of ligamentous injury.  MRI of the cervical spine as dictated above.  I will discuss with neurosurgery.   Earleen Newport, MD 08/19/19 Pauline Aus

## 2019-08-19 NOTE — ED Notes (Signed)
Images powershared to [facility] per Arnell.

## 2019-12-02 ENCOUNTER — Other Ambulatory Visit: Payer: Self-pay | Admitting: Family Medicine

## 2019-12-02 DIAGNOSIS — Z780 Asymptomatic menopausal state: Secondary | ICD-10-CM

## 2019-12-02 DIAGNOSIS — T07XXXA Unspecified multiple injuries, initial encounter: Secondary | ICD-10-CM

## 2019-12-05 ENCOUNTER — Other Ambulatory Visit: Payer: Self-pay | Admitting: Family Medicine

## 2019-12-05 DIAGNOSIS — Z1231 Encounter for screening mammogram for malignant neoplasm of breast: Secondary | ICD-10-CM

## 2019-12-19 NOTE — Progress Notes (Signed)
[facility]  [address] [place], [place] [address] Phone: [phone]  Fax: [phone]   Clinic Day:  12/20/2019  Referring physician: Baldo Ash*  Chief Complaint: Allison [name] is a 69 y.o. female with thrombocytosis who is referred in consultation by Dr. Barbette Reichmann for assessment and management.   HPI: The patient was seen Dr. Casimer Lanius on 12/02/2019 for follow-up of elevated blood pressures.  She was noted to have a persistently elevated platelet count. She has stable stage IIIa chronic kidney disease. She had a recent fall resulting in multiple wrist fractures; she was in need of a bone density scan.  SPEP was normal. LDH was 174.  CBC followed: 04/07/2008: Hematocrit 41.9, hemoglobin 14.2, platelets 415,000, WBC   8,800. 08/22/2010: Hematocrit 36.5, hemoglobin 12.4, platelets 434,000, WBC 11,500. 04/28/2016: Hematocrit 37.1, hemoglobin 12.8, platelets 518,000, WBC   9,800. 09/19/2017: Hematocrit 35.6, hemoglobin 12.2, platelets 453,000, WBC   8,900. 07/30/2018: Hematocrit 39.3, hemoglobin 12.8, platelets 478,000, WBC   5,900. 07/13/2019: Hematocrit 39.0, hemoglobin 12.9, platelets 586,000, WBC 17,400. 08/19/2019: Hematocrit 40.6, hemoglobin 12.8, platelets 470,000, WBC 11,000. 12/02/2019: Hematocrit 36.8, hemoglobin 11.8, platelets 573,000, WBC 11,200 with an ANC of 6900.  Her last mammogram was in 2014. Her last colonoscopy was 5+ years ago at [facility]; she states that it was normal.  The patient has known about her thrombocytosis for about two years.  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 She has not had any problems with thrombosis or any autoimmune disorders. She gets UTIs once or twice yearly, has had C. difficile twice, and had pneumonia over the winter. She states that she is allergic to a lot of antibiotics.  Symptomatically, she has been tired all the time for about a year. She gets tired while  cooking and vacuuming. She does not have sleep apnea and she does not snore. She wakes up in the morning and does not feel refreshed. She takes amitriptyline to help her sleep through the night.   The patient notes occasional headaches, diarrhea, and hot flashes. Her nose is runny. She feels off balance and fell in 08/2019. Sometimes, she feels lightheaded and faints. She has had right hip pain recently. She has essential tremors. She has memory trouble. The patient denies sore throat, cough, shortness of breath, chest pain, nausea, vomiting, skin changes, and bleeding of any kind.   The patient eats an [place] muffin or bagel for breakfast and greek yogurt and a banana or salad for lunch. She states that she eats a healthy dinner with a lot of leafy green vegetables. She has never had iron deficiency anemia or been on oral iron. She craves donuts.  The patient had cataract surgery years ago and notes that her vision is not as good now and it was right after she had the surgery.  Her uncle died from liver cancer. Two of her cousins had breast cancer.   Past Medical History:  Diagnosis Date  . Arrhythmia   . Heart murmur   . Mitral valve prolapse   . Syncope     Past Surgical History:  Procedure Laterality Date  . HERNIA REPAIR    . partial knee replacement     Left  . VAGINAL HYSTERECTOMY      Family History  Problem Relation Age of Onset  . Heart attack Father        cause of death.  . Hyperlipidemia Mother   . Dementia Mother   . Stroke  Mother     Social History:  reports that she quit smoking about 31 years ago. She has never used smokeless tobacco. She reports current alcohol use. No history on file for drug use. During a week, she drinks about 2 glasses of wine and four beers. She denies tobacco use. She denies exposure to radiation and toxins. She uses hemp oil for essential tremors. 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   She is from [place].  She lives in [place].  The patient is alone today.  Allergies:  Allergies  Allergen Reactions  . Amoxicillin Diarrhea  . Doxycycline Swelling  . Indomethacin Other (See Comments)  . Sulfa Antibiotics     Current Medications: Current Outpatient Medications  Medication Sig Dispense Refill  . amitriptyline (ELAVIL) 25 MG tablet Take 25 mg by mouth at bedtime.    . fludrocortisone (FLORINEF) 0.1 MG tablet Take 0.1 mg by mouth daily.    Marland Kitchen atenolol (TENORMIN) 25 MG tablet     . HYDROcodone-acetaminophen (NORCO/VICODIN) 5-325 MG tablet Take 1 tablet by mouth every 6 (six) hours as needed for moderate pain. 30 tablet 0  . meclizine (ANTIVERT) 25 MG tablet Take 1 tablet (25 mg total) by mouth 3 (three) times daily as needed for dizziness. (Patient not taking: Reported on 12/20/2019) 30 tablet 0  . ondansetron (ZOFRAN ODT) 4 MG disintegrating tablet Take 1 tablet (4 mg total) by mouth every 6 (six) hours as needed for nausea or vomiting. 20 tablet 0  . ondansetron (ZOFRAN ODT) 4 MG disintegrating tablet Take 1 tablet (4 mg total) by mouth every 8 (eight) hours as needed for nausea or vomiting. 20 tablet 0  . pentosan polysulfate (ELMIRON) 100 MG capsule Take 100 mg by mouth 2 (two) times daily.    . potassium chloride (K-DUR) 10 MEQ tablet Take 1 tablet (10 mEq total) by mouth daily. 5 tablet 0   No current facility-administered medications for this visit.    Review of Systems  Constitutional: Positive for malaise/fatigue. Negative for chills, diaphoresis, fever and weight loss.  HENT: Negative for congestion, ear discharge, ear pain, hearing loss, nosebleeds, sinus pain, sore throat and tinnitus.        Runny nose.  Eyes: Negative for blurred vision.       S/p cataract surgery years ago.  Respiratory: Negative for cough, hemoptysis, sputum production and shortness of breath.   Cardiovascular: Negative for chest pain, palpitations and leg swelling.  Gastrointestinal:  Positive for diarrhea (occasional). Negative for abdominal pain, blood in stool, constipation, heartburn, melena, nausea and vomiting.  Genitourinary: Negative for dysuria, frequency, hematuria and urgency.       Nocturia.  Musculoskeletal: Positive for falls (08/2019) and joint pain (right hip). Negative for back pain, myalgias and neck pain.  Skin: Negative for itching and rash.  Neurological: Positive for dizziness (occasional, causes her to faint), tremors (essential, uses hemp oil) and headaches (occasional). Negative for tingling, sensory change and weakness.       Feels off balance.  Endo/Heme/Allergies: Does not bruise/bleed easily.       Hot flashes, occasional.  Psychiatric/Behavioral: Positive for memory loss. Negative for depression. The patient is not nervous/anxious and does not have insomnia.   All other systems reviewed and are negative.  Performance status (ECOG): 1  Vitals Blood pressure (!) 142/76, pulse 72, temperature 97.7 F (36.5 C), temperature source Tympanic, resp. rate 18, height 5\' 7"  (1.702 m),  weight 142 lb 12 oz (64.8 kg), SpO2 100 %.   Physical Exam Vitals and nursing note reviewed.  Constitutional:      General: She is not in acute distress.    Appearance: She is not diaphoretic.     Interventions: Face mask in place.  HENT:     Head: Normocephalic and atraumatic.     Comments: Gray shoulder length hair.    Mouth/Throat:     Mouth: Mucous membranes are moist.     Pharynx: Oropharynx is clear.  Eyes:     General: No scleral icterus.    Extraocular Movements: Extraocular movements intact.     Conjunctiva/sclera: Conjunctivae normal.     Pupils: Pupils are equal, round, and reactive to light.     Comments: Blue eyes.  Cardiovascular:     Rate and Rhythm: Normal rate and regular rhythm.     Heart sounds: Normal heart sounds. No murmur heard.   Pulmonary:     Effort: Pulmonary effort is normal. No respiratory distress.     Breath sounds: Normal  breath sounds. No wheezing or rales.  Chest:     Chest wall: No tenderness.  Abdominal:     General: Bowel sounds are normal. There is no distension.     Palpations: Abdomen is soft. There is no hepatomegaly, splenomegaly or mass.     Tenderness: There is no abdominal tenderness. There is no guarding or rebound.  Musculoskeletal:        General: No swelling or tenderness. Normal range of motion.     Cervical back: Normal range of motion and neck supple.  Lymphadenopathy:     Head:     Right side of head: No preauricular, posterior auricular or occipital adenopathy.     Left side of head: No preauricular, posterior auricular or occipital adenopathy.     Cervical: No cervical adenopathy.     Upper Body:     Right upper body: No supraclavicular or axillary adenopathy.     Left upper body: No supraclavicular or axillary adenopathy.     Lower Body: No right inguinal adenopathy. No left inguinal adenopathy.  Skin:    General: Skin is warm and dry.  Neurological:     Mental Status: She is alert and oriented to person, place, and time.  Psychiatric:        Behavior: Behavior normal.        Thought Content: Thought content normal.        Judgment: Judgment normal.    No visits with results within 3 Day(s) from this visit.  Latest known visit with results is:  Admission on 08/19/2019, Discharged on 08/19/2019  Component Date Value Ref Range Status  . WBC 08/19/2019 11.0* 4.0 - 10.5 K/uL Final  . RBC 08/19/2019 4.67  3.87 - 5.11 MIL/uL Final  . Hemoglobin 08/19/2019 12.8  12.0 - 15.0 g/dL Final  . HCT 08/19/2019 40.6  36 - 46 % Final  . MCV 08/19/2019 86.9  80.0 - 100.0 fL Final  . MCH 08/19/2019 27.4  26.0 - 34.0 pg Final  . MCHC 08/19/2019 31.5  30.0 - 36.0 g/dL Final  . RDW 08/19/2019 14.2  11.5 - 15.5 % Final  . Platelets 08/19/2019 470* 150 - 400 K/uL Final  . nRBC 08/19/2019 0.0  0.0 - 0.2 % Final   Performed at [facility], [address], [place], [place] [address]   . Sodium 08/19/2019 139  135 - 145 mmol/L Final  . Potassium  08/19/2019 3.8  3.5 - 5.1 mmol/L Final  . Chloride 08/19/2019 108  98 - 111 mmol/L Final  . CO2 08/19/2019 23  22 - 32 mmol/L Final  . Glucose, Bld 08/19/2019 87  70 - 99 mg/dL Final   Glucose reference range applies only to samples taken after fasting for at least 8 hours.  . BUN 08/19/2019 19  8 - 23 mg/dL Final  . Creatinine, Ser 08/19/2019 0.97  0.44 - 1.00 mg/dL Final  . Calcium 08/19/2019 8.8* 8.9 - 10.3 mg/dL Final  . GFR calc non Af Amer 08/19/2019 >60  >60 mL/min Final  . GFR calc Af Amer 08/19/2019 >60  >60 mL/min Final  . Anion gap 08/19/2019 8  5 - 15 Final   Performed at [facility], [address] [place],  [address]    Assessment:  LATEYA DAURIA is a 69 y.o. female with thrombocytosis.  Platelet count has ranged between 415,000 - 586,000 without trend since 03/2008.  She denies any issues with recurrent infections.  She has UTIs 1-2 times a year and has a history of C. difficile x2.  She denies any iron deficiency.  She eats a small amount of beef.  She denies ice pica.  CBC on 12/02/2019 revealed a hematocrit 36.8, hemoglobin 11.8, platelets 573,000, WBC 11,200 with an ANC of 6900.   The patient received the COVID-19 vaccine.  Symptomatically, she has been tired all the time for about a year.  She does not have sleep apnea; she wakes up in the morning and does not feel refreshed. Sometimes, she feels lightheaded and faints.  Exam reveals no adenopathy or hepatosplenomegaly.  Plan: 1.   Labs today: CBC with diff, CMP, sed rate, CRP, JAK2 with reflex. 2.   Peripheral smear for pathologic review. 3.   Thrombocytosis  Etiology is unclear.  Differential includes primary (myeloproliferative disorders) or secondary causes (reactive).  Discuss initial work-up. 4.   RTC in 1 week for MD assessment, review of of work-up, and discussion regarding direction of therapy.  I discussed the  assessment and treatment plan with the patient.  The patient was provided an opportunity to ask questions and all were answered.  The patient agreed with the plan and demonstrated an understanding of the instructions.  The patient was advised to call back if the symptoms worsen or if the condition fails to improve as anticipated.  I provided 34 minutes of face-to-face time during this this encounter and > 50% was spent counseling as documented under my assessment and plan.  An additional 10 minutes were spent reviewing her chart (Epic and Care Everywhere) including notes, labs, and imaging studies.    Melissa C. Mike Gip, MD, PhD    12/20/2019, 2:22 PM  I, Mirian Mo Tufford, am acting as Education administrator for [employer]. Mike Gip, MD, PhD.  I, Melissa C. Mike Gip, MD, have reviewed the above documentation for accuracy and completeness, and I agree with the above.

## 2019-12-20 ENCOUNTER — Inpatient Hospital Stay: Payer: Medicare PPO

## 2019-12-20 ENCOUNTER — Encounter: Payer: Self-pay | Admitting: Hematology and Oncology

## 2019-12-20 ENCOUNTER — Other Ambulatory Visit: Payer: Self-pay

## 2019-12-20 ENCOUNTER — Inpatient Hospital Stay: Payer: Medicare PPO | Attending: Hematology and Oncology | Admitting: Hematology and Oncology

## 2019-12-20 VITALS — BP 142/76 | HR 72 | Temp 97.7°F | Resp 18 | Ht 67.0 in | Wt 142.7 lb

## 2019-12-20 DIAGNOSIS — D509 Iron deficiency anemia, unspecified: Secondary | ICD-10-CM | POA: Diagnosis not present

## 2019-12-20 DIAGNOSIS — R232 Flushing: Secondary | ICD-10-CM | POA: Insufficient documentation

## 2019-12-20 DIAGNOSIS — Z800 Family history of malignant neoplasm of digestive organs: Secondary | ICD-10-CM | POA: Diagnosis not present

## 2019-12-20 DIAGNOSIS — N1831 Chronic kidney disease, stage 3a: Secondary | ICD-10-CM | POA: Diagnosis not present

## 2019-12-20 DIAGNOSIS — Z803 Family history of malignant neoplasm of breast: Secondary | ICD-10-CM | POA: Insufficient documentation

## 2019-12-20 DIAGNOSIS — Z79899 Other long term (current) drug therapy: Secondary | ICD-10-CM | POA: Diagnosis not present

## 2019-12-20 DIAGNOSIS — Z87891 Personal history of nicotine dependence: Secondary | ICD-10-CM | POA: Insufficient documentation

## 2019-12-20 DIAGNOSIS — R7989 Other specified abnormal findings of blood chemistry: Secondary | ICD-10-CM | POA: Insufficient documentation

## 2019-12-20 DIAGNOSIS — Z83438 Family history of other disorder of lipoprotein metabolism and other lipidemia: Secondary | ICD-10-CM | POA: Insufficient documentation

## 2019-12-20 DIAGNOSIS — D75839 Thrombocytosis, unspecified: Secondary | ICD-10-CM | POA: Insufficient documentation

## 2019-12-20 DIAGNOSIS — M85852 Other specified disorders of bone density and structure, left thigh: Secondary | ICD-10-CM | POA: Insufficient documentation

## 2019-12-20 DIAGNOSIS — R519 Headache, unspecified: Secondary | ICD-10-CM | POA: Diagnosis not present

## 2019-12-20 DIAGNOSIS — R42 Dizziness and giddiness: Secondary | ICD-10-CM | POA: Insufficient documentation

## 2019-12-20 DIAGNOSIS — I341 Nonrheumatic mitral (valve) prolapse: Secondary | ICD-10-CM | POA: Insufficient documentation

## 2019-12-20 DIAGNOSIS — Z8249 Family history of ischemic heart disease and other diseases of the circulatory system: Secondary | ICD-10-CM | POA: Diagnosis not present

## 2019-12-20 DIAGNOSIS — R030 Elevated blood-pressure reading, without diagnosis of hypertension: Secondary | ICD-10-CM | POA: Insufficient documentation

## 2019-12-20 DIAGNOSIS — R197 Diarrhea, unspecified: Secondary | ICD-10-CM | POA: Insufficient documentation

## 2019-12-20 DIAGNOSIS — D473 Essential (hemorrhagic) thrombocythemia: Secondary | ICD-10-CM

## 2019-12-20 LAB — CBC WITH DIFFERENTIAL/PLATELET
Abs Immature Granulocytes: 0.04 10*3/uL (ref 0.00–0.07)
Basophils Absolute: 0.1 10*3/uL (ref 0.0–0.1)
Basophils Relative: 1 %
Eosinophils Absolute: 0.3 10*3/uL (ref 0.0–0.5)
Eosinophils Relative: 3 %
HCT: 36.8 % (ref 36.0–46.0)
Hemoglobin: 11.8 g/dL — ABNORMAL LOW (ref 12.0–15.0)
Immature Granulocytes: 0 %
Lymphocytes Relative: 31 %
Lymphs Abs: 2.9 10*3/uL (ref 0.7–4.0)
MCH: 26.9 pg (ref 26.0–34.0)
MCHC: 32.1 g/dL (ref 30.0–36.0)
MCV: 83.8 fL (ref 80.0–100.0)
Monocytes Absolute: 0.7 10*3/uL (ref 0.1–1.0)
Monocytes Relative: 7 %
Neutro Abs: 5.4 10*3/uL (ref 1.7–7.7)
Neutrophils Relative %: 58 %
Platelets: 531 10*3/uL — ABNORMAL HIGH (ref 150–400)
RBC: 4.39 MIL/uL (ref 3.87–5.11)
RDW: 13.8 % (ref 11.5–15.5)
WBC: 9.3 10*3/uL (ref 4.0–10.5)
nRBC: 0 % (ref 0.0–0.2)

## 2019-12-20 LAB — COMPREHENSIVE METABOLIC PANEL
ALT: 15 U/L (ref 0–44)
AST: 20 U/L (ref 15–41)
Albumin: 4.2 g/dL (ref 3.5–5.0)
Alkaline Phosphatase: 83 U/L (ref 38–126)
Anion gap: 7 (ref 5–15)
BUN: 18 mg/dL (ref 8–23)
CO2: 27 mmol/L (ref 22–32)
Calcium: 9.1 mg/dL (ref 8.9–10.3)
Chloride: 104 mmol/L (ref 98–111)
Creatinine, Ser: 0.92 mg/dL (ref 0.44–1.00)
GFR calc Af Amer: >60 mL/min (ref >60)
GFR calc non Af Amer: >60 mL/min (ref >60)
Glucose, Bld: 99 mg/dL (ref 70–99)
Potassium: 4 mmol/L (ref 3.5–5.1)
Sodium: 138 mmol/L (ref 135–145)
Total Bilirubin: 0.5 mg/dL (ref 0.3–1.2)
Total Protein: 7.4 g/dL (ref 6.5–8.1)

## 2019-12-20 LAB — SEDIMENTATION RATE: Sed Rate: 9 mm/hr (ref 0–30)

## 2019-12-21 LAB — C-REACTIVE PROTEIN: CRP: 0.6 mg/dL (ref <1.0)

## 2019-12-21 LAB — PATHOLOGIST SMEAR REVIEW

## 2019-12-22 ENCOUNTER — Ambulatory Visit
Admission: RE | Admit: 2019-12-22 | Discharge: 2019-12-22 | Disposition: A | Discharge status: Home / Self Care | Payer: Medicare PPO | Source: Ambulatory Visit | Attending: Family Medicine | Admitting: Family Medicine

## 2019-12-22 ENCOUNTER — Other Ambulatory Visit: Payer: Self-pay

## 2019-12-22 DIAGNOSIS — Z780 Asymptomatic menopausal state: Secondary | ICD-10-CM

## 2019-12-22 DIAGNOSIS — T07XXXA Unspecified multiple injuries, initial encounter: Secondary | ICD-10-CM | POA: Diagnosis present

## 2019-12-22 DIAGNOSIS — Z1231 Encounter for screening mammogram for malignant neoplasm of breast: Secondary | ICD-10-CM

## 2019-12-26 NOTE — Progress Notes (Signed)
[facility]  [address] [place], [place] [address] Phone: [phone]  Fax: [phone]   Clinic Day:  12/27/2019  Referring physician: Angelene Giovanni Prim*  Chief Complaint: Allison [name] is a 69 y.o. female with thrombocytosis who is seen for review of work up and discussion regarding direction of therapy.  HPI: The patient was last seen in the hematology clinic on 12/20/2019 for new patient assessment.  She had a history of thrombocytosis x 2 years.  Platelet count had ranged between 415,000 - 586,000 without trend.  She described fatigue x 1 year.    Work-up revealed a hematocrit was 36.8, hemoglobin 11.8, platelets 531,000, WBC 9,300 with an ANC of 5400.  Differential revealed 58% segs, 31% lymphs, 7% monocytes, 3% eosinophils, and 1% basophils. CMP was normal. Sed rate was 9 and CRP 0.6.  JAK2, V617F, and CALR/E12/MPL are pending.  Peripheral smear revealed a high normal WBC with unremarkable differential. There was no significant left shift in maturation and no circulating blasts. Granulocytes showed appropriate nuclear lobation and cytoplasmic granularity. There was mild normocytic anemia without significant anisopoikilocytosis. There was thrombocytosis with rare giant platelets. The significance of this patient's thrombocytosis was unclear by morphologic review alone.   Bone density on 12/22/2019 revealed osteopenia with a T score of -2.1 at the left femur neck.  Screening mammogram on 12/22/2019 revealed no evidence of malignancy.  During the interim, she has felt "ok".  She is seeing orthopedics tomorrow because she injured her left knee, though she is unsure how she did it. Her right hip pain, headaches, tremors, dizziness, hot flashes and balance problems are stable. Her diarrhea has resolved. She has an occasional runny nose.   Past Medical History:  Diagnosis Date  . Arrhythmia   . Heart murmur   . Mitral valve prolapse   .  Syncope     Past Surgical History:  Procedure Laterality Date  . HERNIA REPAIR    . partial knee replacement     Left  . VAGINAL HYSTERECTOMY      Family History  Problem Relation Age of Onset  . Heart attack Father        cause of death.  . Hyperlipidemia Mother   . Dementia Mother   . Stroke Mother     Social History:  reports that she quit smoking about 31 years ago. She has never used smokeless tobacco. She reports current alcohol use. No history on file for drug use. In a week, she drinks about 2 glasses of wine and four beers. Denies tobacco use. Denies exposure to radiation and toxins. She uses hemp oil for essential tremors. 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 She is originally from [place]. The patient is alone today.  Allergies:  Allergies  Allergen Reactions  . Amoxicillin Diarrhea  . Doxycycline Swelling  . Indomethacin Other (See Comments)  . Sulfa Antibiotics     Current Medications: Current Outpatient Medications  Medication Sig Dispense Refill  . amitriptyline (ELAVIL) 25 MG tablet Take 25 mg by mouth at bedtime.    Marland Kitchen atenolol (TENORMIN) 25 MG tablet     . cyanocobalamin 1000 MCG tablet Take 1,000 mcg by mouth daily.    . pentosan polysulfate (ELMIRON) 100 MG capsule Take 100 mg by mouth 2 (two) times daily.    . Probiotic Product (PROBIOTIC DAILY PO) Take by mouth.    . fludrocortisone (FLORINEF) 0.1  MG tablet Take 0.1 mg by mouth daily. (Patient not taking: Reported on 12/27/2019)    . HYDROcodone-acetaminophen (NORCO/VICODIN) 5-325 MG tablet Take 1 tablet by mouth every 6 (six) hours as needed for moderate pain. (Patient not taking: Reported on 12/27/2019) 30 tablet 0  . meclizine (ANTIVERT) 25 MG tablet Take 1 tablet (25 mg total) by mouth 3 (three) times daily as needed for dizziness. (Patient not taking: Reported on 12/20/2019) 30 tablet 0  . ondansetron (ZOFRAN ODT) 4 MG disintegrating tablet  Take 1 tablet (4 mg total) by mouth every 6 (six) hours as needed for nausea or vomiting. (Patient not taking: Reported on 12/27/2019) 20 tablet 0  . ondansetron (ZOFRAN ODT) 4 MG disintegrating tablet Take 1 tablet (4 mg total) by mouth every 8 (eight) hours as needed for nausea or vomiting. (Patient not taking: Reported on 12/27/2019) 20 tablet 0  . potassium chloride (K-DUR) 10 MEQ tablet Take 1 tablet (10 mEq total) by mouth daily. (Patient not taking: Reported on 12/27/2019) 5 tablet 0   No current facility-administered medications for this visit.    Review of Systems  Constitutional: Positive for malaise/fatigue. Negative for chills, diaphoresis, fever and weight loss (up 1 lb).  HENT: Negative for congestion, ear discharge, ear pain, hearing loss, nosebleeds, sinus pain, sore throat and tinnitus.        Runny nose occasionally  Eyes: Negative for blurred vision.       S/p cataract surgery years ago  Respiratory: Negative for cough, hemoptysis, sputum production and shortness of breath.   Cardiovascular: Negative for chest pain, palpitations and leg swelling.  Gastrointestinal: Negative for abdominal pain, blood in stool, constipation, diarrhea, heartburn, melena, nausea and vomiting.  Genitourinary: Negative for dysuria, frequency, hematuria and urgency.  Musculoskeletal: Positive for joint pain (right hip). Negative for back pain, falls, myalgias and neck pain.  Skin: Negative for itching and rash.  Neurological: Positive for dizziness (occasional), tremors (essential, uses hemp oil) and headaches (occasional). Negative for tingling, sensory change and weakness.       Feels off balance.  Endo/Heme/Allergies: Does not bruise/bleed easily.       Hot flashes, occasional  Psychiatric/Behavioral: Positive for memory loss. Negative for depression. The patient is not nervous/anxious and does not have insomnia.   All other systems reviewed and are negative.  Performance status (ECOG):  1  Vitals Blood pressure 138/86, pulse 65, temperature 97.6 F (36.4 C), temperature source Tympanic, resp. rate 18, weight 143 lb (64.9 kg), SpO2 100 %.   Physical Exam Vitals and nursing note reviewed.  Constitutional:      General: She is not in acute distress.    Appearance: She is not diaphoretic.  HENT:     Head:     Comments: Gray hair pulled back. Eyes:     General: No scleral icterus.    Conjunctiva/sclera: Conjunctivae normal.     Comments: Blue eyes.  Musculoskeletal:     Comments: Left leg brace.  Neurological:     Mental Status: She is alert and oriented to person, place, and time.  Psychiatric:        Behavior: Behavior normal.        Thought Content: Thought content normal.        Judgment: Judgment normal.    No visits with results within 3 Day(s) from this visit.  Latest known visit with results is:  Office Visit on 12/20/2019  Component Date Value Ref Range Status  . CRP 12/20/2019 0.6  <1.0  mg/dL Final   Performed at [facility], [place] [address], [place], [place] [address]  . Sed Rate 12/20/2019 9  0 - 30 mm/hr Final   Performed at [facility], [address], [place], [place] [address]  . Sodium 12/20/2019 138  135 - 145 mmol/L Final  . Potassium 12/20/2019 4.0  3.5 - 5.1 mmol/L Final  . Chloride 12/20/2019 104  98 - 111 mmol/L Final  . CO2 12/20/2019 27  22 - 32 mmol/L Final  . Glucose, Bld 12/20/2019 99  70 - 99 mg/dL Final   Glucose reference range applies only to samples taken after fasting for at least 8 hours.  . BUN 12/20/2019 18  8 - 23 mg/dL Final  . Creatinine, Ser 12/20/2019 0.92  0.44 - 1.00 mg/dL Final  . Calcium 12/20/2019 9.1  8.9 - 10.3 mg/dL Final  . Total Protein 12/20/2019 7.4  6.5 - 8.1 g/dL Final  . Albumin 12/20/2019 4.2  3.5 - 5.0 g/dL Final  . AST 12/20/2019 20  15 - 41 U/L Final  . ALT 12/20/2019 15  0 - 44 U/L Final  . Alkaline Phosphatase 12/20/2019 83  38 - 126 U/L Final  . Total Bilirubin 12/20/2019  0.5  0.3 - 1.2 mg/dL Final  . GFR calc non Af Amer 12/20/2019 >60  >60 mL/min Final  . GFR calc Af Amer 12/20/2019 >60  >60 mL/min Final  . Anion gap 12/20/2019 7  5 - 15 Final   Performed at [facility], [address] [place], [place] [address]  . WBC 12/20/2019 9.3  4.0 - 10.5 K/uL Final  . RBC 12/20/2019 4.39  3.87 - 5.11 MIL/uL Final  . Hemoglobin 12/20/2019 11.8* 12.0 - 15.0 g/dL Final  . HCT 12/20/2019 36.8  36 - 46 % Final  . MCV 12/20/2019 83.8  80.0 - 100.0 fL Final  . MCH 12/20/2019 26.9  26.0 - 34.0 pg Final  . MCHC 12/20/2019 32.1  30.0 - 36.0 g/dL Final  . RDW 12/20/2019 13.8  11.5 - 15.5 % Final  . Platelets 12/20/2019 531* 150 - 400 K/uL Final  . nRBC 12/20/2019 0.0  0.0 - 0.2 % Final  . Neutrophils Relative % 12/20/2019 58  % Final  . Neutro Abs 12/20/2019 5.4  1.7 - 7.7 K/uL Final  . Lymphocytes Relative 12/20/2019 31  % Final  . Lymphs Abs 12/20/2019 2.9  0.7 - 4.0 K/uL Final  . Monocytes Relative 12/20/2019 7  % Final  . Monocytes Absolute 12/20/2019 0.7  0 - 1 K/uL Final  . Eosinophils Relative 12/20/2019 3  % Final  . Eosinophils Absolute 12/20/2019 0.3  0 - 0 K/uL Final  . Basophils Relative 12/20/2019 1  % Final  . Basophils Absolute 12/20/2019 0.1  0 - 0 K/uL Final  . Immature Granulocytes 12/20/2019 0  % Final  . Abs Immature Granulocytes 12/20/2019 0.04  0.00 - 0.07 K/uL Final   Performed at [facility], [address] [place], [place] [address]  . Path Review 12/20/2019 Blood smear is reviewed.   Final   Comment: High normal WBC with unremarkable differential. No significant left shift in maturation, and no circulating blasts. Granulocytes show appropriate nuclear lobation and cytoplasmic granularity. Mild normocytic anemia without significant anisopoikilocytosis. Thrombocytosis, with rare giant platelets. The significance of this patient's thrombocytosis is unclear by morphologic review alone. If thrombocytosis persists, or  there is concern for an underlying myeloproliferative process, bone marrow  biopsy and/or molecular testing for mutations of JAK2, CALR, or MPL may provide a more comprehensive assessment. Clinical correlation is recommended. Reviewed by Kathi Simpers, M.D. Performed at [facility], [address], [place], [place] [address]     Assessment:  TAVA PEERY is a 69 y.o. female with thrombocytosis.  Platelet count has ranged between 415,000 - 586,000 without trend since 03/2008.  She denies any issues with recurrent infections.  She has UTIs 1-2 times a year and has a history of C. difficile x2.  She denies any iron deficiency.  She eats a small amount of beef.  She denies ice pica.  CBC on 12/02/2019 revealed a hematocrit 36.8, hemoglobin 11.8, platelets 573,000, WBC 11,200 with an ANC of 6900.   Work-up on 12/20/2019 revealed a hematocrit was 36.8, hemoglobin 11.8, platelets 531,000, WBC 9,300 with an ANC of 5400.  Differential revealed 58% segs, 31% lymphs, 7% monocytes, 3% eosinophils, and 1% basophils. CMP was normal. Sed rate was 9 and CRP 0.6.  JAK2, V617F, and CALR/E12/MPL are pending.  Peripheral smear revelaed a high normal WBC with unremarkable differential. There was no significant left shift in maturation and no circulating blasts. Granulocytes showed appropriate nuclear lobation and cytoplasmic granularity. There was mild normocytic anemia without significant anisopoikilocytosis. There was thrombocytosis with rare giant platelets. The significance of this patient's thrombocytosis was unclear by morphologic review alone.   Bone density on 12/22/2019 revealed osteopenia with a T score of -2.1 at the left femur neck.  Screening mammogram on 12/22/2019 revealed no evidence of malignancy.  The patient received the COVID-19 vaccine.  Symptomatically, she notes fatigue.  She recently injured her knee.    Plan: 1.   Thrombocytosis             Platelet count was 531,000 on  12/20/2019.    Work-up is currently negative.    JAK2 with reflex is pending.  Suspect etiology is reactive. 2.   Health maintenance  Bone density study revealed osteopenia.   Encourage calcium and vitamin D.  Mammogram revealed no evidence of malignancy. 3.   RN or MD to contact patient regarding JAK2 testing. 4.   RTC in 3 months for MD assessment and labs (CBC with diff +/- others).  Addendum:  JAK2 V617F, exon 12-15, CLAR, and MPL were normal.  I discussed the assessment and treatment plan with the patient.  The patient was provided an opportunity to ask questions and all were answered.  The patient agreed with the plan and demonstrated an understanding of the instructions.  The patient was advised to call back if the symptoms worsen or if the condition fails to improve as anticipated.   Yarelli Decelles C. Mike Gip, MD, PhD    12/27/2019, 11:33 AM  I, Mirian Mo Tufford, am acting as Education administrator for [employer]. Mike Gip, MD, PhD.  I, Christoffer Currier C. Mike Gip, MD, have reviewed the above documentation for accuracy and completeness, and I agree with the above.

## 2019-12-27 ENCOUNTER — Other Ambulatory Visit: Payer: Self-pay

## 2019-12-27 ENCOUNTER — Inpatient Hospital Stay (HOSPITAL_BASED_OUTPATIENT_CLINIC_OR_DEPARTMENT_OTHER): Payer: Medicare PPO | Admitting: Hematology and Oncology

## 2019-12-27 ENCOUNTER — Encounter: Payer: Self-pay | Admitting: Hematology and Oncology

## 2019-12-27 VITALS — BP 138/86 | HR 65 | Temp 97.6°F | Resp 18 | Wt 143.0 lb

## 2019-12-27 DIAGNOSIS — D473 Essential (hemorrhagic) thrombocythemia: Secondary | ICD-10-CM

## 2019-12-27 DIAGNOSIS — Z0000 Encounter for general adult medical examination without abnormal findings: Secondary | ICD-10-CM | POA: Diagnosis not present

## 2019-12-27 DIAGNOSIS — D75839 Thrombocytosis, unspecified: Secondary | ICD-10-CM

## 2019-12-27 DIAGNOSIS — R7989 Other specified abnormal findings of blood chemistry: Secondary | ICD-10-CM | POA: Diagnosis not present

## 2019-12-27 LAB — CALR + JAK2 E12-15 + MPL (REFLEXED)

## 2019-12-27 LAB — JAK2 V617F, W REFLEX TO CALR/E12/MPL

## 2019-12-27 NOTE — Progress Notes (Signed)
Patient hurt left knee, unsure what she did, she sees ortho tomorrow.

## 2020-01-14 DIAGNOSIS — Z0000 Encounter for general adult medical examination without abnormal findings: Secondary | ICD-10-CM | POA: Insufficient documentation

## 2020-03-26 NOTE — Progress Notes (Signed)
[facility]  [address] [place], [place] [address] Phone: [phone]  Fax: [phone]   Clinic Day:  03/28/2020  Referring physician: Angelene Giovanni Prim*  Chief Complaint: Allison [name] is a 69 y.o. female with thrombocytosis who is seen for 3 month assessment.  HPI: The patient was last seen in the hematology clinic on 12/27/2019. At that time, she noted fatigue.  She had recently injured her knee. Hematocrit was 36.8, hemoglobin 11.8, platelets 531,000, WBC 9,300. CMP was normal. Sed rate was 9. CRP was 0.6. JAK2 V617F, exon 12-15, CALR, and MPL were negative.     Peripheral smear revealed no significant left shift in maturation and no circulating blasts. Granulocytes showed appropriate nuclear lobation and cytoplasmic granularity. There was mild normocytic anemia without significant anisopoikilocytosis. There was thrombocytosis with rare giant platelets.  Etiology of thrombocytosis was felt reactive.  During the interim, she has been "good." She is still tired and does not know why because she sleeps well and rests regularly. She does not feel rested when she wakes up in the morning. She used to snore. She agrees to sleep apnea testing.  The patient still uses hemp oil for her tremor. She still has a lot of joint pain and feels dizzy and off balance. She is seeing physical therapy and does her exercises regularly.  The patient notes that about a week ago, her blood pressure was running in the 150s/100s for 4-5 days. It has been normal since then.  She takes vitamin B12.   Past Medical History:  Diagnosis Date  . Arrhythmia   . Heart murmur   . Mitral valve prolapse   . Syncope     Past Surgical History:  Procedure Laterality Date  . HERNIA REPAIR    . partial knee replacement     Left  . VAGINAL HYSTERECTOMY      Family History  Problem Relation Age of Onset  . Heart attack Father        cause of death.  .  Hyperlipidemia Mother   . Dementia Mother   . Stroke Mother     Social History:  reports that she quit smoking about 31 years ago. She has never used smokeless tobacco. She reports current alcohol use. No history on file for drug use. In a week, she drinks about 2 glasses of wine and four beers. Denies tobacco use. Denies exposure to radiation and toxins. She uses hemp oil for essential tremors. 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 She is originally from [place]. The patient is alone today.  Allergies:  Allergies  Allergen Reactions  . Amoxicillin Diarrhea  . Doxycycline Swelling  . Indomethacin Other (See Comments)  . Sulfa Antibiotics     Current Medications: Current Outpatient Medications  Medication Sig Dispense Refill  . amitriptyline (ELAVIL) 25 MG tablet Take 25 mg by mouth at bedtime.    Marland Kitchen atenolol (TENORMIN) 25 MG tablet     . cyanocobalamin 1000 MCG tablet Take 1,000 mcg by mouth daily.    . fluticasone (FLONASE) 50 MCG/ACT nasal spray Place 1 spray into both nostrils daily as needed for allergies or rhinitis.    Marland Kitchen ondansetron (ZOFRAN ODT) 4 MG disintegrating tablet Take 1 tablet (4 mg total) by mouth every 6 (six) hours as needed for nausea or vomiting. 20 tablet 0  . pentosan polysulfate (ELMIRON) 100 MG capsule Take 100 mg by mouth  2 (two) times daily.    . Probiotic Product (PROBIOTIC DAILY PO) Take by mouth.    Marland Kitchen amitriptyline (ELAVIL) 50 MG tablet     . fludrocortisone (FLORINEF) 0.1 MG tablet Take 0.1 mg by mouth daily. (Patient not taking: Reported on 12/27/2019)    . HYDROcodone-acetaminophen (NORCO/VICODIN) 5-325 MG tablet Take 1 tablet by mouth every 6 (six) hours as needed for moderate pain. (Patient not taking: Reported on 12/27/2019) 30 tablet 0  . meclizine (ANTIVERT) 25 MG tablet Take 1 tablet (25 mg total) by mouth 3 (three) times daily as needed for dizziness. (Patient not taking: Reported on 12/20/2019)  30 tablet 0  . ondansetron (ZOFRAN ODT) 4 MG disintegrating tablet Take 1 tablet (4 mg total) by mouth every 8 (eight) hours as needed for nausea or vomiting. (Patient not taking: Reported on 12/27/2019) 20 tablet 0  . potassium chloride (K-DUR) 10 MEQ tablet Take 1 tablet (10 mEq total) by mouth daily. (Patient not taking: Reported on 12/27/2019) 5 tablet 0   No current facility-administered medications for this visit.    Review of Systems  Constitutional: Positive for malaise/fatigue and weight loss (6 lbs). Negative for chills, diaphoresis and fever.       Feels "good".  HENT: Negative for congestion, ear discharge, ear pain, hearing loss, nosebleeds, sinus pain, sore throat and tinnitus.   Eyes: Negative for blurred vision.       S/p cataract surgery years ago.  Respiratory: Negative for cough, hemoptysis, sputum production and shortness of breath.   Cardiovascular: Negative for chest pain, palpitations and leg swelling.  Gastrointestinal: Negative for abdominal pain, blood in stool, constipation, diarrhea, heartburn, melena, nausea and vomiting.  Genitourinary: Negative for dysuria, frequency, hematuria and urgency.  Musculoskeletal: Positive for joint pain (right hip, hands). Negative for back pain, falls, myalgias and neck pain.  Skin: Negative for itching and rash.  Neurological: Positive for dizziness (occasional), tremors (essential, uses hemp oil) and headaches (occasional). Negative for tingling, sensory change and weakness.       Feels off balance.  Endo/Heme/Allergies: Does not bruise/bleed easily.  Psychiatric/Behavioral: Positive for memory loss. Negative for depression. The patient is not nervous/anxious and does not have insomnia.   All other systems reviewed and are negative.  Performance status (ECOG): 1  Vitals Blood pressure 129/74, pulse 62, temperature 98.7 F (37.1 C), temperature source Tympanic, resp. rate 18, height 5\' 7"  (1.702 m), weight 137 lb 3.8 oz (62.2  kg), SpO2 100 %.   Physical Exam Vitals and nursing note reviewed.  Constitutional:      General: She is not in acute distress.    Appearance: She is not diaphoretic.  HENT:     Head: Normocephalic and atraumatic.     Comments: Short gray hair.    Mouth/Throat:     Mouth: Mucous membranes are moist.     Pharynx: Oropharynx is clear.  Eyes:     General: No scleral icterus.    Extraocular Movements: Extraocular movements intact.     Conjunctiva/sclera: Conjunctivae normal.     Pupils: Pupils are equal, round, and reactive to light.     Comments: Blue eyes.  Cardiovascular:     Rate and Rhythm: Normal rate and regular rhythm.     Heart sounds: Normal heart sounds. No murmur heard.   Pulmonary:     Effort: Pulmonary effort is normal. No respiratory distress.     Breath sounds: Normal breath sounds. No wheezing or rales.  Chest:  Chest wall: No tenderness.  Abdominal:     General: Bowel sounds are normal. There is no distension.     Palpations: Abdomen is soft. There is no hepatomegaly, splenomegaly or mass.     Tenderness: There is no abdominal tenderness. There is no guarding or rebound.  Musculoskeletal:        General: No swelling or tenderness. Normal range of motion.     Cervical back: Normal range of motion and neck supple.  Lymphadenopathy:     Head:     Right side of head: No preauricular, posterior auricular or occipital adenopathy.     Left side of head: No preauricular, posterior auricular or occipital adenopathy.     Cervical: No cervical adenopathy.     Upper Body:     Right upper body: No supraclavicular or axillary adenopathy.     Left upper body: No supraclavicular or axillary adenopathy.     Lower Body: No right inguinal adenopathy. No left inguinal adenopathy.  Skin:    General: Skin is warm and dry.  Neurological:     Mental Status: She is alert and oriented to person, place, and time.  Psychiatric:        Behavior: Behavior normal.        Thought  Content: Thought content normal.        Judgment: Judgment normal.    Appointment on 03/28/2020  Component Date Value Ref Range Status  . WBC 03/28/2020 10.1  4.0 - 10.5 K/uL Final  . RBC 03/28/2020 4.39  3.87 - 5.11 MIL/uL Final  . Hemoglobin 03/28/2020 11.4* 12.0 - 15.0 g/dL Final  . HCT 03/28/2020 35.7* 36 - 46 % Final  . MCV 03/28/2020 81.3  80.0 - 100.0 fL Final  . MCH 03/28/2020 26.0  26.0 - 34.0 pg Final  . MCHC 03/28/2020 31.9  30.0 - 36.0 g/dL Final  . RDW 03/28/2020 15.9* 11.5 - 15.5 % Final  . Platelets 03/28/2020 502* 150 - 400 K/uL Final  . nRBC 03/28/2020 0.0  0.0 - 0.2 % Final  . Neutrophils Relative % 03/28/2020 63  % Final  . Neutro Abs 03/28/2020 6.5  1.7 - 7.7 K/uL Final  . Lymphocytes Relative 03/28/2020 25  % Final  . Lymphs Abs 03/28/2020 2.5  0.7 - 4.0 K/uL Final  . Monocytes Relative 03/28/2020 7  % Final  . Monocytes Absolute 03/28/2020 0.7  0.1 - 1.0 K/uL Final  . Eosinophils Relative 03/28/2020 3  % Final  . Eosinophils Absolute 03/28/2020 0.3  0.0 - 0.5 K/uL Final  . Basophils Relative 03/28/2020 1  % Final  . Basophils Absolute 03/28/2020 0.1  0.0 - 0.1 K/uL Final  . Immature Granulocytes 03/28/2020 1  % Final  . Abs Immature Granulocytes 03/28/2020 0.05  0.00 - 0.07 K/uL Final   Performed at [facility], [address] [place],  [address]    Assessment:  Allison [name] is a 69 y.o. female with thrombocytosis.  Platelet count has ranged between 415,000 - 586,000 without trend since 03/2008.  She denies any issues with recurrent infections.  She has UTIs 1-2 times a year and has a history of C. difficile x2.  She denies any iron deficiency.  She eats a small amount of beef.  She denies ice pica.  CBC on 12/02/2019 revealed a hematocrit 36.8, hemoglobin 11.8, platelets 573,000, WBC 11,200 with an ANC of 6900.   Work-up on 12/20/2019 revealed a hematocrit was 36.8, hemoglobin 11.8, platelets 531,000,  WBC 9,300 with an [place] of  5400.  Differential revealed 58% segs, 31% lymphs, 7% monocytes, 3% eosinophils, and 1% basophils. CMP was normal. Sed rate was 9 and CRP 0.6.  JAK2 V617F, exon 12-15, CALR, and MPL were negative.  Peripheral smear revelaed a high normal WBC with unremarkable differential. There was no significant left shift in maturation and no circulating blasts. Granulocytes showed appropriate nuclear lobation and cytoplasmic granularity. There was mild normocytic anemia without significant anisopoikilocytosis. There was thrombocytosis with rare giant platelets. The significance of this patient's thrombocytosis was unclear by morphologic review alone.   Bone density on 12/22/2019 revealed osteopenia with a T score of -2.1 at the left femur neck.  Screening mammogram on 12/22/2019 revealed no evidence of malignancy.  The patient received the COVID-19 vaccine.  Symptomatically, she feels tired.  She does not feel rested in the morning.  Exam reveals no adenopathy or hepatosplenomegaly.   Plan: 1.   Labs today: CBC with diff, TSH, free T4. 2.   Thrombocytosis             Platelet count was 531,000 on 12/20/2019.  Platelet count is 502,000 today.  Work-up was negative.  No evidence of a myeloproliferative disorder.    Etiology felt reactive.  Discuss plan for re-evaluation if platelet count or WBC/hemogloblin increase in the future without explanation. 3.   Fatigue  Etiology unclear and felt not related to mild thrombocytosis.  Check TSH and free T4.  Consider overnight oximetry to r/o sleep apnea.   Patient notes a h/o snoring; note rested in AM. 4.   RTC prn.  I discussed the assessment and treatment plan with the patient.  The patient was provided an opportunity to ask questions and all were answered.  The patient agreed with the plan and demonstrated an understanding of the instructions.  The patient was advised to call back if the symptoms worsen or if the condition fails to improve as  anticipated.   Ashayla Subia C. Mike Gip, MD, PhD    03/28/2020, 10:27 AM  I, Mirian Mo Tufford, am acting as Education administrator for [employer]. Mike Gip, MD, PhD.  I, Tere Mcconaughey C. Mike Gip, MD, have reviewed the above documentation for accuracy and completeness, and I agree with the above.

## 2020-03-28 ENCOUNTER — Encounter: Payer: Self-pay | Admitting: Hematology and Oncology

## 2020-03-28 ENCOUNTER — Other Ambulatory Visit: Payer: Self-pay

## 2020-03-28 ENCOUNTER — Inpatient Hospital Stay: Payer: Medicare PPO | Attending: Hematology and Oncology | Admitting: Hematology and Oncology

## 2020-03-28 ENCOUNTER — Inpatient Hospital Stay: Payer: Medicare PPO

## 2020-03-28 VITALS — BP 129/74 | HR 62 | Temp 98.7°F | Resp 18 | Ht 67.0 in | Wt 137.2 lb

## 2020-03-28 DIAGNOSIS — D75839 Thrombocytosis, unspecified: Secondary | ICD-10-CM | POA: Diagnosis not present

## 2020-03-28 DIAGNOSIS — M85852 Other specified disorders of bone density and structure, left thigh: Secondary | ICD-10-CM | POA: Diagnosis not present

## 2020-03-28 DIAGNOSIS — D649 Anemia, unspecified: Secondary | ICD-10-CM | POA: Insufficient documentation

## 2020-03-28 DIAGNOSIS — Z8249 Family history of ischemic heart disease and other diseases of the circulatory system: Secondary | ICD-10-CM | POA: Insufficient documentation

## 2020-03-28 DIAGNOSIS — Z79899 Other long term (current) drug therapy: Secondary | ICD-10-CM | POA: Diagnosis not present

## 2020-03-28 DIAGNOSIS — D75838 Other thrombocytosis: Secondary | ICD-10-CM | POA: Diagnosis not present

## 2020-03-28 DIAGNOSIS — G4730 Sleep apnea, unspecified: Secondary | ICD-10-CM | POA: Insufficient documentation

## 2020-03-28 DIAGNOSIS — R5383 Other fatigue: Secondary | ICD-10-CM

## 2020-03-28 DIAGNOSIS — Z87891 Personal history of nicotine dependence: Secondary | ICD-10-CM | POA: Insufficient documentation

## 2020-03-28 DIAGNOSIS — Z8349 Family history of other endocrine, nutritional and metabolic diseases: Secondary | ICD-10-CM | POA: Diagnosis not present

## 2020-03-28 LAB — CBC WITH DIFFERENTIAL/PLATELET
Abs Immature Granulocytes: 0.05 10*3/uL (ref 0.00–0.07)
Basophils Absolute: 0.1 10*3/uL (ref 0.0–0.1)
Basophils Relative: 1 %
Eosinophils Absolute: 0.3 10*3/uL (ref 0.0–0.5)
Eosinophils Relative: 3 %
HCT: 35.7 % — ABNORMAL LOW (ref 36.0–46.0)
Hemoglobin: 11.4 g/dL — ABNORMAL LOW (ref 12.0–15.0)
Immature Granulocytes: 1 %
Lymphocytes Relative: 25 %
Lymphs Abs: 2.5 10*3/uL (ref 0.7–4.0)
MCH: 26 pg (ref 26.0–34.0)
MCHC: 31.9 g/dL (ref 30.0–36.0)
MCV: 81.3 fL (ref 80.0–100.0)
Monocytes Absolute: 0.7 10*3/uL (ref 0.1–1.0)
Monocytes Relative: 7 %
Neutro Abs: 6.5 10*3/uL (ref 1.7–7.7)
Neutrophils Relative %: 63 %
Platelets: 502 10*3/uL — ABNORMAL HIGH (ref 150–400)
RBC: 4.39 MIL/uL (ref 3.87–5.11)
RDW: 15.9 % — ABNORMAL HIGH (ref 11.5–15.5)
WBC: 10.1 10*3/uL (ref 4.0–10.5)
nRBC: 0 % (ref 0.0–0.2)

## 2020-03-28 LAB — T4, FREE: Free T4: 0.81 ng/dL (ref 0.61–1.12)

## 2020-03-28 LAB — TSH: TSH: 2.071 u[IU]/mL (ref 0.350–4.500)

## 2020-03-28 NOTE — Progress Notes (Signed)
No new changes noted today 

## 2022-02-09 IMAGING — CR DG CHEST 2V
2 series · 2 of 2 positions shown · non-contrast
Comparison: Chest radiograph 08/23/2010.

CLINICAL DATA: Shortness of breath/chest pain. Additional history
provided: Patient reports sent from [HOSPITAL], diagnosed with
pneumonia on [REDACTED] and given inhaler, doxycycline, prednisone and
promethazine, first COVID vaccine last [REDACTED] and today having
chest pain and redness and swelling in face.

EXAM:
CHEST - 2 VIEW

[chest pa]
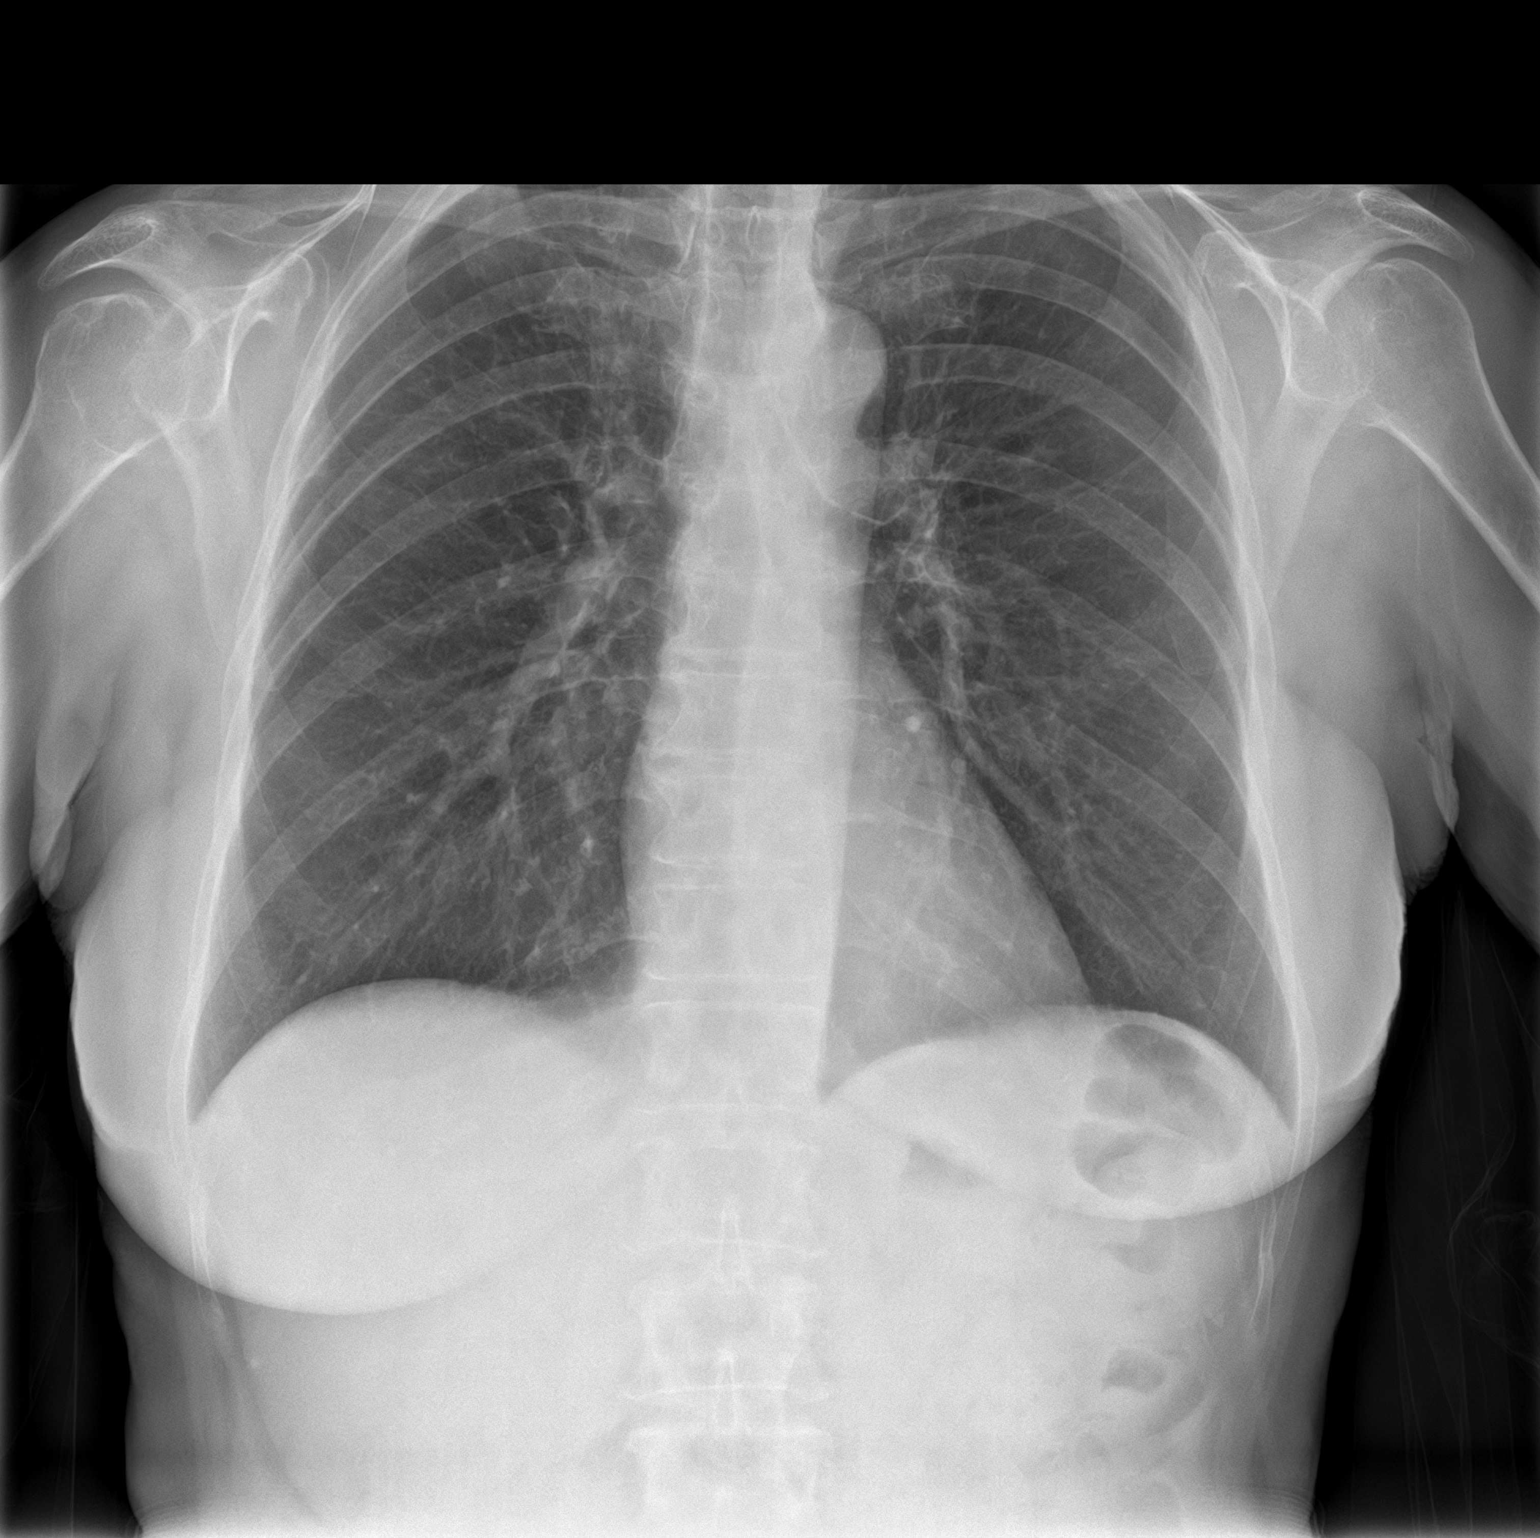

[chest lat]
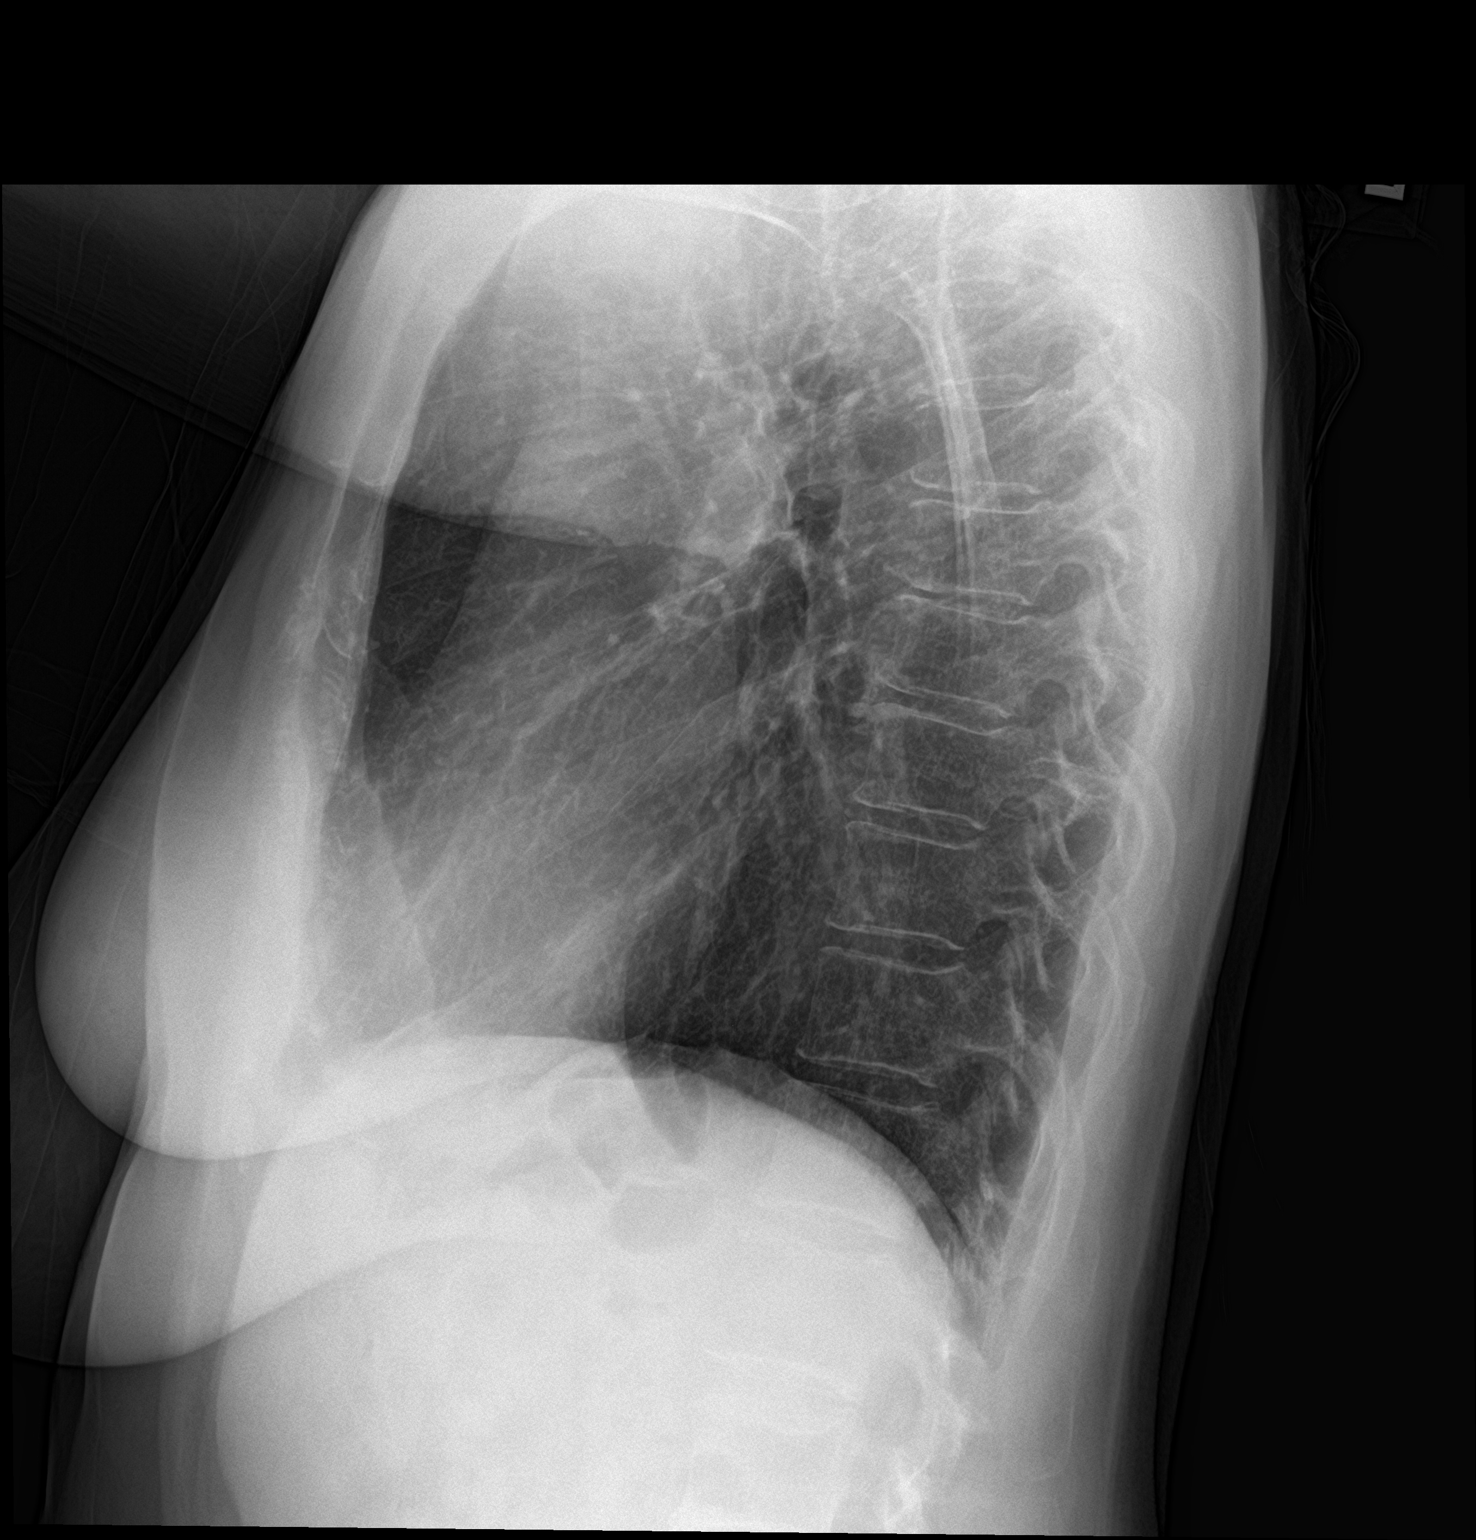

[2 of 2 positions shown; findings below may reference images not displayed]

FINDINGS: Heart size within normal limits. No evidence of airspace
consolidation within the lungs. No evidence of pleural effusion or
pneumothorax. No acute bony abnormality.
IMPRESSION: No evidence of acute cardiopulmonary abnormality.

## 2022-03-18 IMAGING — CT CT CHEST W/O CM
2 of 3 series · 15 of 36 positions shown, 18 images · non-contrast
Comparison: 07/13/2019, 06/03/2005

CLINICAL DATA: Tripped and fell, anticoagulated, left wrist
deformity

EXAM:
CT CHEST WITHOUT CONTRAST
TECHNIQUE: Multidetector CT imaging of the chest was performed following the
standard protocol without IV contrast.

[Series 2: thorax · axial · 0.65mm/px · z∈[-365,-69]mm · 12 of 174 slices shown, 15 images]
[im 13/174  mediastinal]
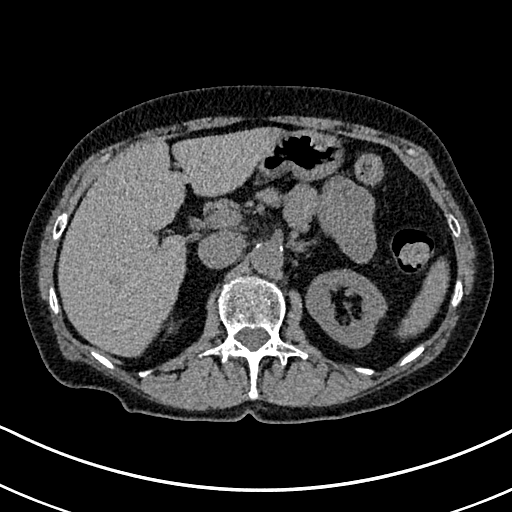
[im 13/174  lung]
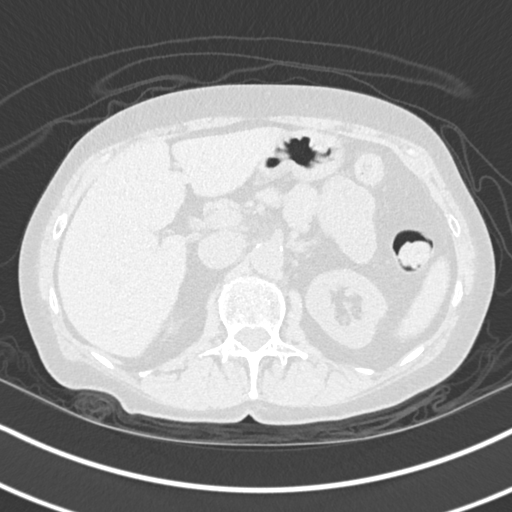
[im 26/174  lung]
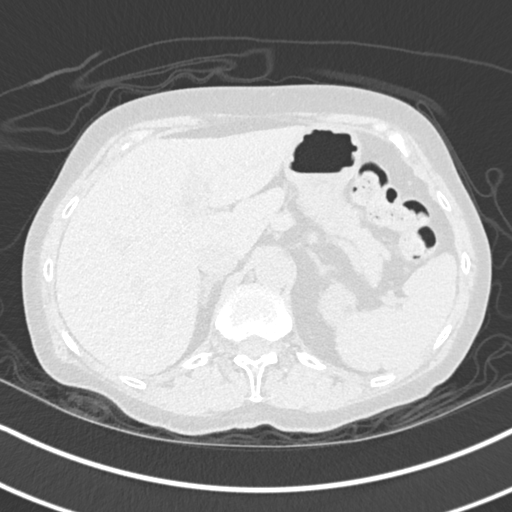
[im 39/174  lung]
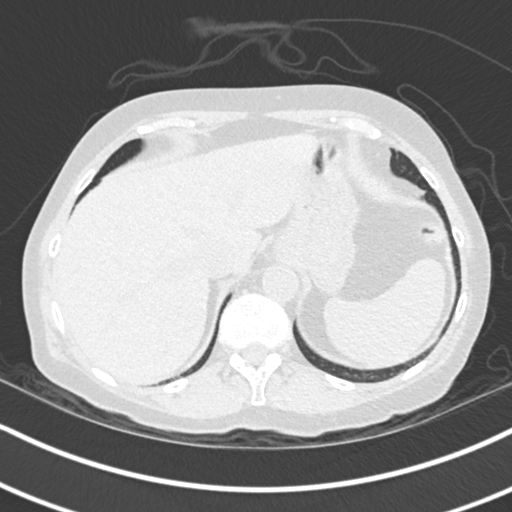
[im 52/174  lung]
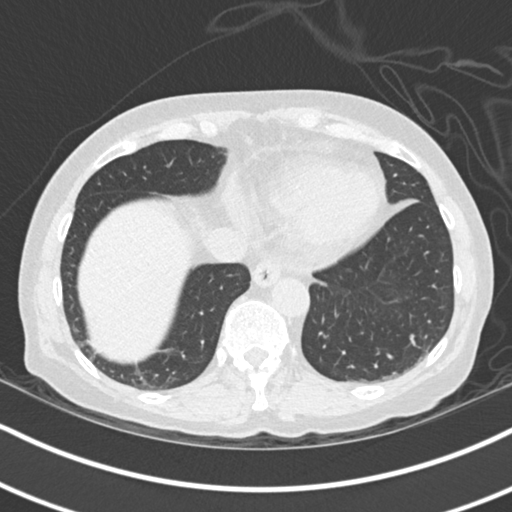
[im 65/174  mediastinal]
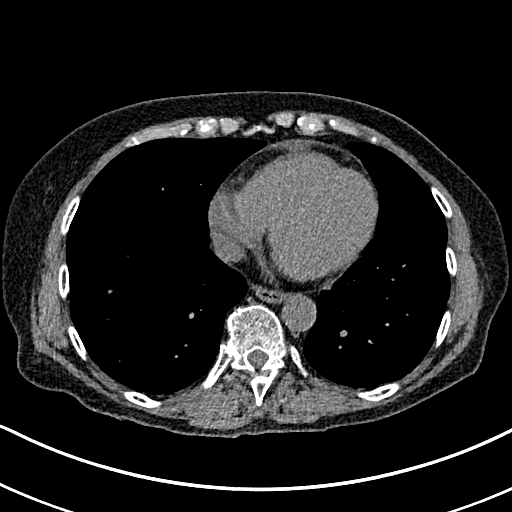
[im 65/174  lung]
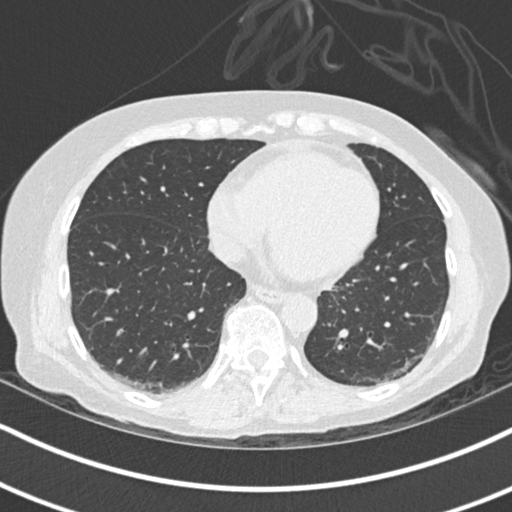
[im 77/174  lung]
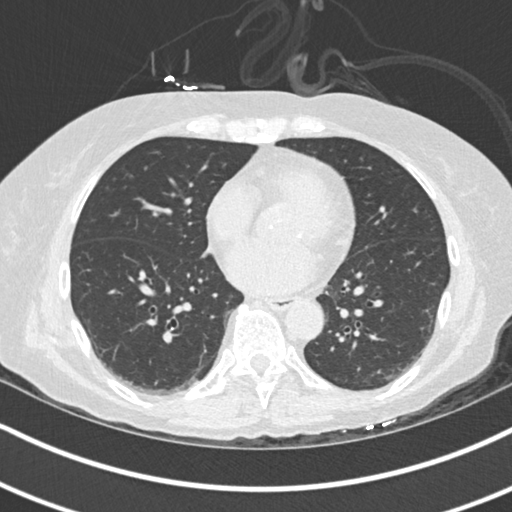
[im 97/174  lung]
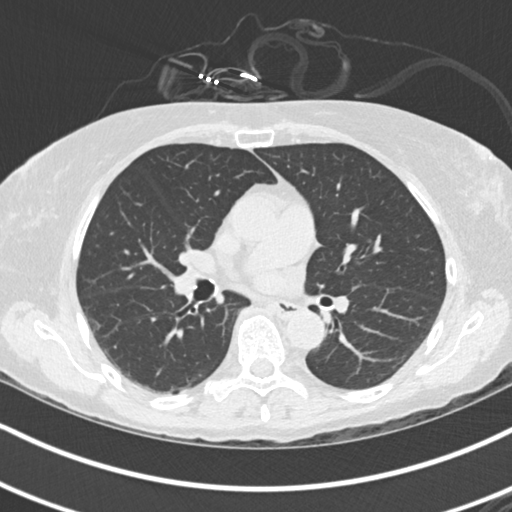
[im 109/174  lung]
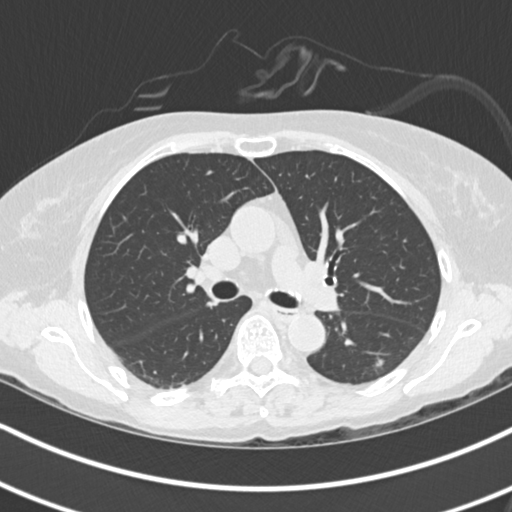
[im 122/174  mediastinal]
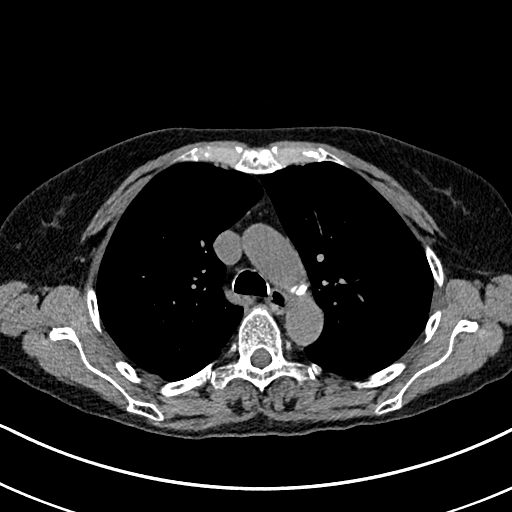
[im 122/174  lung]
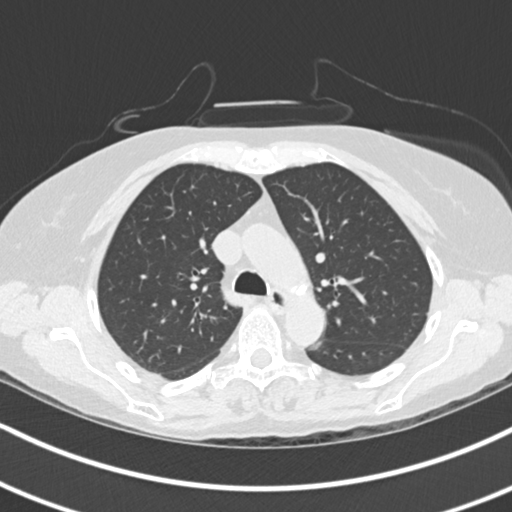
[im 135/174  lung]
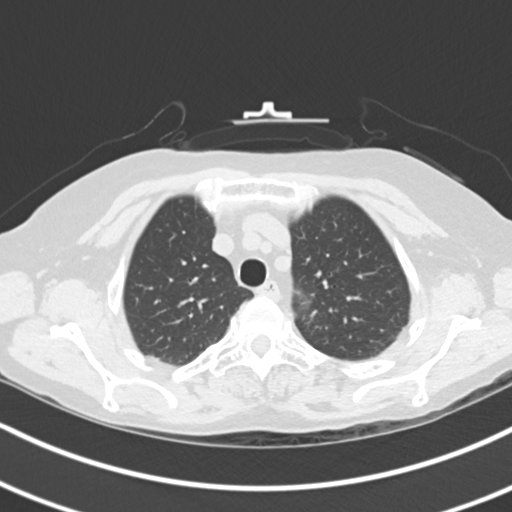
[im 148/174  lung]
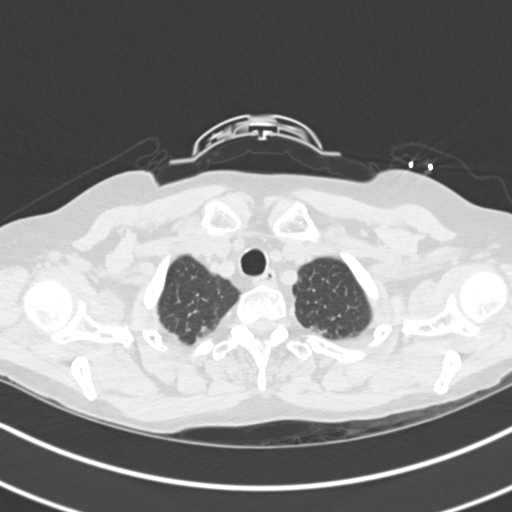
[im 161/174  lung]
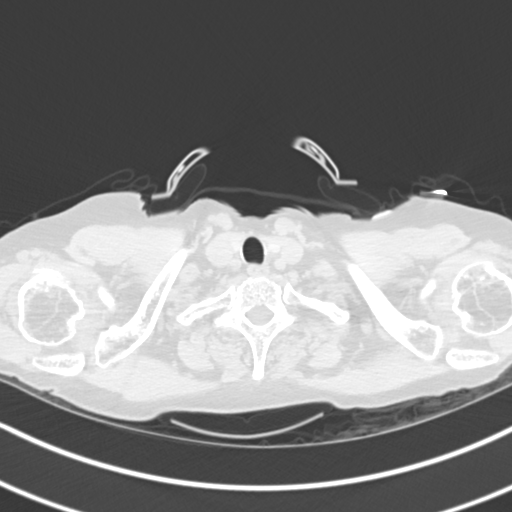

[Series 5: coronal · coronal · 0.72mm/px · 3 of 115 slices shown]
[im 23/115  lung]
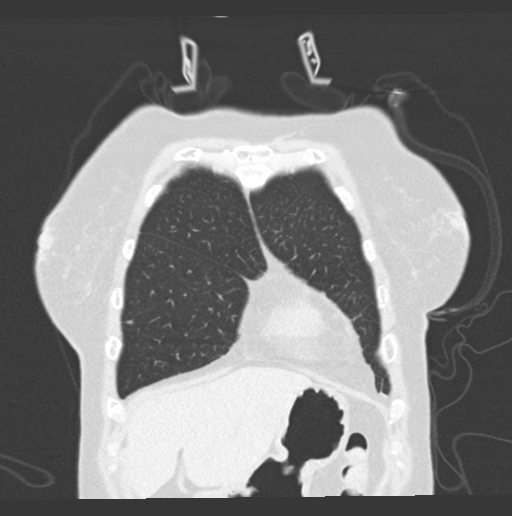
[im 46/115  lung]
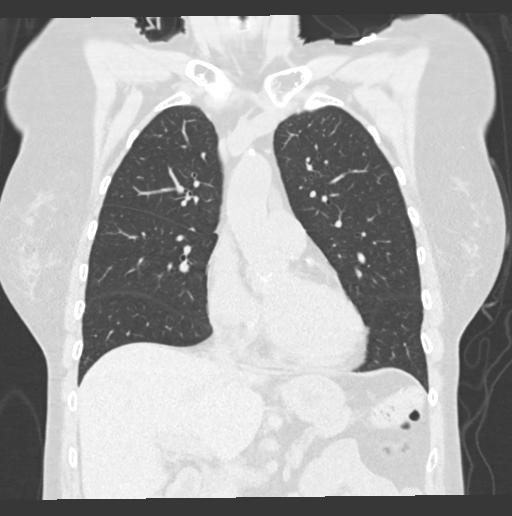
[im 69/115  lung]
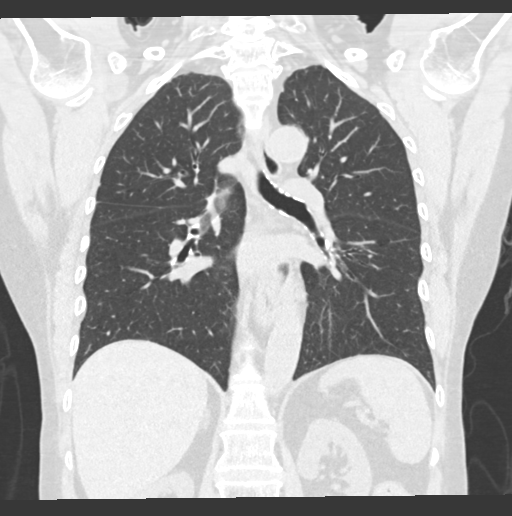

[15 of 36 positions shown; findings below may reference images not displayed]

FINDINGS: Cardiovascular: Heart is unremarkable without significant
pericardial effusion. Minimal atherosclerosis of the aorta and
coronary vessels. Normal caliber of the thoracic aorta. Evaluation
of the mediastinal and vascular structures limited by the lack of
intravenous contrast in the setting of trauma.

Mediastinum/Nodes: No enlarged mediastinal or axillary lymph nodes.
Thyroid gland, trachea, and esophagus demonstrate no significant
findings.

Lungs/Pleura: Subpleural scarring within the dependent lower lobes.
No airspace disease, effusion, or pneumothorax. The central airways
are patent.

Upper Abdomen: No acute abnormality.

Musculoskeletal: No acute displaced fractures. Reconstructed images
demonstrate no additional findings.
IMPRESSION: 1. No evidence of intrathoracic trauma on this unenhanced exam.
2.  Aortic Atherosclerosis ([id number]).

## 2023-07-22 ENCOUNTER — Telehealth: Payer: Self-pay

## 2023-07-22 NOTE — Telephone Encounter (Signed)
 The patient called in to schedule appointment. I inform the patient that there is no new patient appointment available at this time and I will add her number to the call back list just in case someone cancel. She said that is fine because she uses to go to [facility] year ago. They told her it will be 6 month a new appointment.

## 2023-08-03 ENCOUNTER — Telehealth: Payer: Self-pay | Admitting: Internal Medicine

## 2023-08-03 NOTE — Telephone Encounter (Signed)
 Good afternoon Dr. Leonides Schanz  The following patient is asking to return to [place] for colonic stool burden, constipation and discomfort. She has been to see [facility] and only wants to come to [place] to have a for her GI issues. She still wants to continue care with [facility]. Records are available with care everywhere in Epic. Please review and advise of scheduling. Thank you.

## 2023-08-03 NOTE — Telephone Encounter (Signed)
 At this time we do not have the capacity to accept new patients. It looks like the patient has an upcoming colonoscopy appt with [facility] and she can continue to follow up with him as an outpatient.
